# Patient Record
Sex: Female | Born: 1980 | Race: Black or African American | Hispanic: No | Smoking: Current every day smoker
Health system: Southern US, Community
[De-identification: ages and names within clinical notes are randomized; demographics above are authoritative.]

## PROBLEM LIST (undated history)

## (undated) DIAGNOSIS — J449 Chronic obstructive pulmonary disease, unspecified: Secondary | ICD-10-CM

## (undated) DIAGNOSIS — I10 Essential (primary) hypertension: Secondary | ICD-10-CM

## (undated) HISTORY — PX: KNEE ARTHROSCOPY: SUR90

## (undated) HISTORY — PX: ABDOMINAL HYSTERECTOMY: SHX81

## (undated) HISTORY — PX: CHOLECYSTECTOMY: SHX55

## (undated) HISTORY — PX: APPENDECTOMY: SHX54

## (undated) HISTORY — PX: OTHER SURGICAL HISTORY: SHX169

## (undated) HISTORY — PX: HERNIA REPAIR: SHX51

## (undated) HISTORY — PX: DIAGNOSTIC LAPAROSCOPY: SUR761

---

## 2006-10-29 ENCOUNTER — Emergency Department: Payer: Self-pay | Admitting: Unknown Physician Specialty

## 2007-01-08 ENCOUNTER — Emergency Department: Payer: Self-pay | Admitting: Emergency Medicine

## 2007-01-16 ENCOUNTER — Emergency Department: Payer: Self-pay | Admitting: Emergency Medicine

## 2007-04-21 ENCOUNTER — Emergency Department: Payer: Self-pay | Admitting: Emergency Medicine

## 2007-08-15 ENCOUNTER — Emergency Department: Payer: Self-pay | Admitting: Emergency Medicine

## 2018-04-28 ENCOUNTER — Other Ambulatory Visit: Payer: Self-pay

## 2018-04-28 ENCOUNTER — Encounter: Payer: Self-pay | Admitting: Emergency Medicine

## 2018-04-28 ENCOUNTER — Emergency Department
Admission: EM | Admit: 2018-04-28 | Discharge: 2018-04-28 | Disposition: A | Discharge status: Home / Self Care | Payer: Self-pay | Attending: Emergency Medicine | Admitting: Emergency Medicine

## 2018-04-28 DIAGNOSIS — Z760 Encounter for issue of repeat prescription: Secondary | ICD-10-CM | POA: Insufficient documentation

## 2018-04-28 DIAGNOSIS — Z79899 Other long term (current) drug therapy: Secondary | ICD-10-CM | POA: Insufficient documentation

## 2018-04-28 DIAGNOSIS — I10 Essential (primary) hypertension: Secondary | ICD-10-CM | POA: Insufficient documentation

## 2018-04-28 HISTORY — DX: Essential (primary) hypertension: I10

## 2018-04-28 MED ORDER — MIRTAZAPINE 30 MG PO TABS: 30.0000 mg | ORAL_TABLET | Freq: Every day | ORAL | 0 refills | Status: AC

## 2018-04-28 NOTE — ED Notes (Signed)

## 2018-04-28 NOTE — ED Provider Notes (Signed)
[facility] Emergency Department Provider Note  ____________________________________________   First MD Initiated Contact with Patient 04/28/18 1304     (approximate)  I have reviewed the triage vital signs and the nursing notes.   HISTORY  Chief Complaint Medication Refill   HPI Brenda Hoffman is a 38 y.o. female presents to the ED for a medication refill.  Patient states that she takes Remeron 30 mg daily and is here from [place] and only brought 7 days worth.  She is unable to return to [place] due to the pandemic currently.  She states her primary care provider is in [place].  She has been out of medication for 4 days.  She denies any problems at this time.      Past Medical History:  Diagnosis Date  . Hypertension     There are no active problems to display for this patient.   History reviewed. No pertinent surgical history.  Prior to Admission medications   Medication Sig Start Date End Date Taking? Authorizing Provider  mirtazapine (REMERON) 30 MG tablet Take 1 tablet (30 mg total) by mouth at bedtime. 04/28/18   Tommi Rumps, PA-C    Allergies Patient has no allergy information on record.  No family history on file.  Social History Social History   Tobacco Use  . Smoking status: Not on file  Substance Use Topics  . Alcohol use: Not on file  . Drug use: Not on file    Review of Systems Constitutional: No fever/chills Cardiovascular: Denies chest pain. Respiratory: Denies shortness of breath. Gastrointestinal: No abdominal pain.  No nausea, no vomiting.  Musculoskeletal: Negative for muscle skeletal pain. Skin: Negative for rash. Neurological: Negative for headaches ___________________________________________   PHYSICAL EXAM:  VITAL SIGNS: ED Triage Vitals  Enc Vitals Group     BP --      Pulse --      Resp --      Temp --      Temp src --      SpO2 --      Weight 04/28/18 1301 200 lb (90.7 kg)     Height  04/28/18 1301 5\' 5"  (1.651 m)     Head Circumference --      Peak Flow --      Pain Score 04/28/18 1300 0     Pain Loc --      Pain Edu? --      Excl. in [place]? --    Constitutional: Alert and oriented. Well appearing and in no acute distress. Eyes: Conjunctivae are normal.  Head: Atraumatic. Neck: No stridor.   Cardiovascular: Normal rate, regular rhythm. Grossly normal heart sounds.  Good peripheral circulation. Respiratory: Normal respiratory effort.  No retractions. Lungs CTAB. Musculoskeletal: Moves upper and lower extremities with any difficulty and normal gait was noted. Neurologic:  Normal speech and language. No gross focal neurologic deficits are appreciated. No gait instability. Skin:  Skin is warm, dry and intact. No rash noted. Psychiatric: Mood and affect are normal. Speech and behavior are normal.  ____________________________________________   LABS (all labs ordered are listed, but only abnormal results are displayed)  Labs Reviewed - No data to display  PROCEDURES  Procedure(s) performed (including Critical Care):  Procedures   ____________________________________________   INITIAL IMPRESSION / ASSESSMENT AND PLAN / ED COURSE  As part of my medical decision making, I reviewed the following data within the electronic MEDICAL RECORD NUMBER Notes from prior ED visits and  Controlled  Substance Database  Patient presents to the ED for medication refill.  Patient currently takes Remeron 30 mg daily and is visiting in [place].  She is unable at this time to return home due to the pandemic.  She states she brought enough for 7 days and has been out for 4 days.  She plans to return back to [place] hopefully by April 31st. ____________________________________________   FINAL CLINICAL IMPRESSION(S) / ED DIAGNOSES  Final diagnoses:  Encounter for medication refill     ED Discharge Orders         Ordered    mirtazapine (REMERON) 30 MG tablet  Daily at bedtime      04/28/18 1336           Note:  This document was prepared using Dragon voice recognition software and may include unintentional dictation errors.    Tommi Rumps, PA-C 04/28/18 1341    Arnaldo Natal, MD 04/28/18 1525

## 2018-04-28 NOTE — Discharge Instructions (Signed)
A prescription for 30 days has been written.  Follow-up with your primary care provider in [place] when you are able.

## 2018-04-28 NOTE — ED Notes (Signed)
See triage note  Presents requesting Remeron refill    States she is here from [place] and only brought 7 days   She is not able to go back at present d/t quarantine

## 2018-04-28 NOTE — ED Triage Notes (Signed)
Pt reports takes mirtazapine and has been our for 4 days. Pt states she take 30mg  daily. Pt reports MD is in [place].

## 2018-10-17 ENCOUNTER — Emergency Department
Admission: EM | Admit: 2018-10-17 | Discharge: 2018-10-17 | Disposition: A | Discharge status: Home / Self Care | Payer: Self-pay | Attending: Emergency Medicine | Admitting: Emergency Medicine

## 2018-10-17 ENCOUNTER — Encounter: Payer: Self-pay | Admitting: Emergency Medicine

## 2018-10-17 ENCOUNTER — Other Ambulatory Visit: Payer: Self-pay

## 2018-10-17 DIAGNOSIS — R1110 Vomiting, unspecified: Secondary | ICD-10-CM

## 2018-10-17 DIAGNOSIS — I10 Essential (primary) hypertension: Secondary | ICD-10-CM | POA: Insufficient documentation

## 2018-10-17 DIAGNOSIS — R112 Nausea with vomiting, unspecified: Secondary | ICD-10-CM | POA: Insufficient documentation

## 2018-10-17 DIAGNOSIS — R197 Diarrhea, unspecified: Secondary | ICD-10-CM | POA: Insufficient documentation

## 2018-10-17 LAB — URINALYSIS, COMPLETE (UACMP) WITH MICROSCOPIC
Bacteria, UA: NONE SEEN
Bilirubin Urine: NEGATIVE
Glucose, UA: NEGATIVE mg/dL
Hgb urine dipstick: NEGATIVE
Ketones, ur: NEGATIVE mg/dL
Leukocytes,Ua: NEGATIVE
Nitrite: NEGATIVE
Protein, ur: NEGATIVE mg/dL
Specific Gravity, Urine: 1.028 (ref 1.005–1.030)
pH: 7 (ref 5.0–8.0)

## 2018-10-17 LAB — CBC
HCT: 40.2 % (ref 36.0–46.0)
Hemoglobin: 13.1 g/dL (ref 12.0–15.0)
MCH: 28.9 pg (ref 26.0–34.0)
MCHC: 32.6 g/dL (ref 30.0–36.0)
MCV: 88.7 fL (ref 80.0–100.0)
Platelets: 350 10*3/uL (ref 150–400)
RBC: 4.53 MIL/uL (ref 3.87–5.11)
RDW: 15.8 % — ABNORMAL HIGH (ref 11.5–15.5)
WBC: 6.4 10*3/uL (ref 4.0–10.5)
nRBC: 0 % (ref 0.0–0.2)

## 2018-10-17 LAB — COMPREHENSIVE METABOLIC PANEL
ALT: 11 U/L (ref 0–44)
AST: 17 U/L (ref 15–41)
Albumin: 3.8 g/dL (ref 3.5–5.0)
Alkaline Phosphatase: 72 U/L (ref 38–126)
Anion gap: 9 (ref 5–15)
BUN: 13 mg/dL (ref 6–20)
CO2: 25 mmol/L (ref 22–32)
Calcium: 9.3 mg/dL (ref 8.9–10.3)
Chloride: 110 mmol/L (ref 98–111)
Creatinine, Ser: 0.73 mg/dL (ref 0.44–1.00)
GFR calc Af Amer: >60 mL/min (ref >60)
GFR calc non Af Amer: >60 mL/min (ref >60)
Glucose, Bld: 106 mg/dL — ABNORMAL HIGH (ref 70–99)
Potassium: 4.2 mmol/L (ref 3.5–5.1)
Sodium: 144 mmol/L (ref 135–145)
Total Bilirubin: 0.5 mg/dL (ref 0.3–1.2)
Total Protein: 7.2 g/dL (ref 6.5–8.1)

## 2018-10-17 LAB — PREGNANCY, URINE: Preg Test, Ur: NEGATIVE

## 2018-10-17 LAB — LIPASE, BLOOD: Lipase: 25 U/L (ref 11–51)

## 2018-10-17 MED ORDER — ONDANSETRON 4 MG PO TBDP
4.0000 mg | ORAL_TABLET | Freq: Once | ORAL | Status: AC
Start: 2018-10-17 — End: 2018-10-17
  Administered 2018-10-17: 14:00:00 4 mg via ORAL
  Filled 2018-10-17: qty 1

## 2018-10-17 MED ORDER — SODIUM CHLORIDE 0.9 % IV BOLUS
1000.0000 mL | Freq: Once | INTRAVENOUS | Status: AC
  Administered 2018-10-17: 17:00:00 1000 mL via INTRAVENOUS

## 2018-10-17 MED ORDER — ONDANSETRON HCL 4 MG/2ML IJ SOLN
4.0000 mg | Freq: Once | INTRAMUSCULAR | Status: AC
  Administered 2018-10-17: 17:00:00 4 mg via INTRAVENOUS
  Filled 2018-10-17: qty 2

## 2018-10-17 MED ORDER — DICYCLOMINE HCL 20 MG PO TABS: 20.0000 mg | ORAL_TABLET | Freq: Three times a day (TID) | ORAL | 0 refills | Status: DC | PRN

## 2018-10-17 MED ORDER — KETOROLAC TROMETHAMINE 30 MG/ML IJ SOLN
30.0000 mg | Freq: Once | INTRAMUSCULAR | Status: AC
  Administered 2018-10-17: 17:00:00 30 mg via INTRAVENOUS
  Filled 2018-10-17: qty 1

## 2018-10-17 MED ORDER — ONDANSETRON 4 MG PO TBDP: 4.0000 mg | ORAL_TABLET | Freq: Three times a day (TID) | ORAL | 0 refills | Status: DC | PRN

## 2018-10-17 NOTE — ED Triage Notes (Signed)
Pt reports NVD for the last week. Pt reports intermittent fever as well.

## 2018-10-17 NOTE — ED Provider Notes (Signed)
[facility] Emergency Department Provider Note  Time seen: 4:40 PM  I have reviewed the triage vital signs and the nursing notes.   HISTORY  Chief Complaint Nausea, Diarrhea, and Abdominal Pain   HPI Brenda Hoffman is a 38 y.o. female with a past medical history of hypertension presents to the emergency department for 1 week of nausea vomiting diarrhea.  According to the patient for the past 1 week she has been experiencing intermittent nausea vomiting and diarrhea.  She states yesterday the diarrhea appeared to worsen when she was having a bowel movement approximately once an hour, today she has felt more nauseated with vomiting and has been unable to keep down fluids which came to the emergency department for evaluation.  Denies any fever or shortness of breath.  Denies any abdominal pain.   Past Medical History:  Diagnosis Date  . Hypertension     There are no active problems to display for this patient.   History reviewed. No pertinent surgical history.  Prior to Admission medications   Medication Sig Start Date End Date Taking? Authorizing Provider  mirtazapine (REMERON) 30 MG tablet Take 1 tablet (30 mg total) by mouth at bedtime. 04/28/18   Johnn Hai, PA-C    Not on File  No family history on file.  Social History Social History   Tobacco Use  . Smoking status: Not on file  Substance Use Topics  . Alcohol use: Not on file  . Drug use: Not on file    Review of Systems Constitutional: Negative for fever Cardiovascular: Negative for chest pain. Respiratory: Negative for shortness of breath. Gastrointestinal: Negative for abdominal pain positive for nausea vomiting diarrhea x1 week. Genitourinary: Negative for urinary compaints Musculoskeletal: Negative for musculoskeletal complaints Neurological: Negative for headache All other ROS negative  ____________________________________________   PHYSICAL EXAM:  VITAL SIGNS: ED  Triage Vitals [10/17/18 1406]  Enc Vitals Group     BP (!) 143/87     Pulse Rate 85     Resp 20     Temp 98.3 F (36.8 C)     Temp Source Oral     SpO2 99 %     Weight 210 lb (95.3 kg)     Height 5\' 5"  (1.651 m)     Head Circumference      Peak Flow      Pain Score 8     Pain Loc      Pain Edu?      Excl. in [place]?    Constitutional: Alert and oriented. Well appearing and in no distress. Eyes: Normal exam ENT      Head: Normocephalic and atraumatic.      Mouth/Throat: Mucous membranes are moist. Cardiovascular: Normal rate, regular rhythm.  Respiratory: Normal respiratory effort without tachypnea nor retractions. Breath sounds are clear Gastrointestinal: Soft and nontender. No distention.   Musculoskeletal: Nontender with normal range of motion in all extremities.  Neurologic:  Normal speech and language. No gross focal neurologic deficits  Skin:  Skin is warm, dry and intact.  Psychiatric: Mood and affect are normal.    INITIAL IMPRESSION / ASSESSMENT AND PLAN / ED COURSE  Pertinent labs & imaging results that were available during my care of the patient were reviewed by me and considered in my medical decision making (see chart for details).   Patient presents to the emergency department for nausea vomiting diarrhea x1 week.  Differential would include gastroenteritis, enteritis, colitis or diverticulitis.  Reassuringly  patient has benign abdominal exam.  We will check labs, treat with fluids, Zofran and Toradol and continue to closely monitor.  Patient agreeable to plan of care.  Patient's labs are largely reassuring.  Normal white blood cell count.  Normal anion gap.  Urinalysis negative.  We will discharge the patient with Zofran and Bentyl.  Patient agreeable plan of care.  Brenda Hoffman was evaluated in Emergency Department on 10/17/2018 for the symptoms described in the history of present illness. She was evaluated in the context of the global COVID-19 pandemic, which  necessitated consideration that the patient might be at risk for infection with the SARS-CoV-2 virus that causes COVID-19. Institutional protocols and algorithms that pertain to the evaluation of patients at risk for COVID-19 are in a state of rapid change based on information released by regulatory bodies including the CDC and federal and state organizations. These policies and algorithms were followed during the patient's care in the ED.  ____________________________________________   FINAL CLINICAL IMPRESSION(S) / ED DIAGNOSES  Nausea vomiting diarrhea   Minna Antis, MD 10/17/18 1759

## 2018-10-17 NOTE — ED Notes (Signed)
AAOx3.  Skin warm and dry.  NAD 

## 2019-10-18 ENCOUNTER — Encounter: Payer: Self-pay | Admitting: Emergency Medicine

## 2019-10-18 ENCOUNTER — Emergency Department
Admission: EM | Admit: 2019-10-18 | Discharge: 2019-10-18 | Disposition: A | Discharge status: Home / Self Care | Payer: Self-pay | Attending: Student in an Organized Health Care Education/Training Program | Admitting: Student in an Organized Health Care Education/Training Program

## 2019-10-18 ENCOUNTER — Emergency Department: Payer: Self-pay

## 2019-10-18 ENCOUNTER — Other Ambulatory Visit: Payer: Self-pay

## 2019-10-18 DIAGNOSIS — I10 Essential (primary) hypertension: Secondary | ICD-10-CM | POA: Insufficient documentation

## 2019-10-18 DIAGNOSIS — W010XXA Fall on same level from slipping, tripping and stumbling without subsequent striking against object, initial encounter: Secondary | ICD-10-CM | POA: Insufficient documentation

## 2019-10-18 DIAGNOSIS — M25462 Effusion, left knee: Secondary | ICD-10-CM | POA: Insufficient documentation

## 2019-10-18 DIAGNOSIS — S8992XA Unspecified injury of left lower leg, initial encounter: Secondary | ICD-10-CM | POA: Insufficient documentation

## 2019-10-18 MED ORDER — DICYCLOMINE HCL 20 MG PO TABS: 20.0000 mg | ORAL_TABLET | Freq: Three times a day (TID) | ORAL | 0 refills | Status: AC | PRN

## 2019-10-18 MED ORDER — ONDANSETRON 4 MG PO TBDP: 4.0000 mg | ORAL_TABLET | Freq: Three times a day (TID) | ORAL | 0 refills | Status: DC | PRN

## 2019-10-18 MED ORDER — OXYCODONE-ACETAMINOPHEN 5-325 MG PO TABS
1.0000 | ORAL_TABLET | Freq: Once | ORAL | Status: AC
  Administered 2019-10-18: 1 via ORAL
  Filled 2019-10-18: qty 1

## 2019-10-18 MED ORDER — OXYCODONE-ACETAMINOPHEN 5-325 MG PO TABS: 1.0000 | ORAL_TABLET | Freq: Three times a day (TID) | ORAL | 0 refills | Status: AC | PRN

## 2019-10-18 NOTE — Discharge Instructions (Signed)
Please wear knee immobilizer and use crutches.  You can take Percocet for extreme pain.  Please call orthopedics for a follow-up appointment for recheck.

## 2019-10-18 NOTE — ED Provider Notes (Signed)
[facility] Emergency Department Provider Note  ____________________________________________  Time seen: Approximately 11:27 AM  I have reviewed the triage vital signs and the nursing notes.   HISTORY  Chief Complaint Fall and Knee Injury    HPI Brenda Hoffman is a 39 y.o. female that presents to emergency department for evaluation of left knee pain after a fall today.  Patient was at the car repair shop when she slipped and fell.  She landed on her left knee.  She has had knee surgery in the past but it has been several years and it was in [place] so she does not have an orthopedist here.  She denies any additional injuries.  She did not hit her head or lose consciousness.  Past Medical History:  Diagnosis Date   Hypertension     There are no problems to display for this patient.   History reviewed. No pertinent surgical history.  Prior to Admission medications   Medication Sig Start Date End Date Taking? Authorizing Provider  dicyclomine (BENTYL) 20 MG tablet Take 1 tablet (20 mg total) by mouth 3 (three) times daily as needed for spasms. 10/17/18 10/17/19  Minna Antis, MD  mirtazapine (REMERON) 30 MG tablet Take 1 tablet (30 mg total) by mouth at bedtime. 04/28/18   Tommi Rumps, PA-C  ondansetron (ZOFRAN ODT) 4 MG disintegrating tablet Take 1 tablet (4 mg total) by mouth every 8 (eight) hours as needed for nausea or vomiting. 10/17/18   Minna Antis, MD  oxyCODONE-acetaminophen (PERCOCET) 5-325 MG tablet Take 1 tablet by mouth every 8 (eight) hours as needed for up to 2 days for severe pain. 10/18/19 10/20/19  Enid Derry, PA-C    Allergies Patient has no known allergies.  No family history on file.  Social History Social History   Tobacco Use   Smoking status: Not on file  Substance Use Topics   Alcohol use: Not on file   Drug use: Not on file     Review of Systems  Cardiovascular: No chest pain. Respiratory: No  SOB. Gastrointestinal: No abdominal pain.  No nausea, no vomiting.  Musculoskeletal: Positive for knee pain. Skin: Negative for rash, abrasions, lacerations, ecchymosis. Neurological: Negative for headaches   ____________________________________________   PHYSICAL EXAM:  VITAL SIGNS: ED Triage Vitals  Enc Vitals Group     BP 10/18/19 1020 (!) 131/93     Pulse Rate 10/18/19 1020 69     Resp 10/18/19 1020 20     Temp 10/18/19 1020 98.5 F (36.9 C)     Temp Source 10/18/19 1020 Oral     SpO2 10/18/19 1020 100 %     Weight 10/18/19 0955 211 lb 10.3 oz (96 kg)     Height 10/18/19 0955 5\' 5"  (1.651 m)     Head Circumference --      Peak Flow --      Pain Score --      Pain Loc --      Pain Edu? --      Excl. in [place]? --      Constitutional: Alert and oriented. Well appearing and in no acute distress. Eyes: Conjunctivae are normal. PERRL. EOMI. Head: Atraumatic. ENT:      Ears:      Nose: No congestion/rhinnorhea.      Mouth/Throat: Mucous membranes are moist.  Neck: No stridor.   Cardiovascular: Normal rate, regular rhythm.  Good peripheral circulation. Respiratory: Normal respiratory effort without tachypnea or retractions. Lungs CTAB. Good  air entry to the bases with no decreased or absent breath sounds. Musculoskeletal: Full range of motion to all extremities. No gross deformities appreciated. Full ROM of knee with pain. Tenderness to palpation to lateral left knee with mild swelling.  No ecchymosis. Neurologic:  Normal speech and language. No gross focal neurologic deficits are appreciated.  Skin:  Skin is warm, dry and intact. No rash noted. Psychiatric: Mood and affect are normal. Speech and behavior are normal. Patient exhibits appropriate insight and judgement.   ____________________________________________   LABS (all labs ordered are listed, but only abnormal results are displayed)  Labs Reviewed - No data to  display ____________________________________________  EKG   ____________________________________________  RADIOLOGY Lexine Baton, personally viewed and evaluated these images (plain radiographs) as part of my medical decision making, as well as reviewing the written report by the radiologist.  DG Knee Complete 4 Views Left  Result Date: 10/18/2019 CLINICAL DATA:  Lateral left knee pain after fall EXAM: LEFT KNEE - COMPLETE 4+ VIEW COMPARISON:  None. FINDINGS: No acute fracture. No dislocation. Small left knee joint effusion. Joint spaces are maintained. Well corticated contour deformity of the anterior margin of the upper pole of the patella which may be posttraumatic or developmental. No focal soft tissue swelling. IMPRESSION: 1. Small left knee joint effusion without acute fracture or dislocation. 2. Chronic appearing contour deformity of the anterior margin of the upper pole of the patella which may be posttraumatic or developmental. Electronically Signed   By: Duanne Guess D.O.   On: 10/18/2019 10:58    ____________________________________________    PROCEDURES  Procedure(s) performed:    Procedures    Medications  oxyCODONE-acetaminophen (PERCOCET/ROXICET) 5-325 MG per tablet 1 tablet (1 tablet Oral Given 10/18/19 1130)     ____________________________________________   INITIAL IMPRESSION / ASSESSMENT AND PLAN / ED COURSE  Pertinent labs & imaging results that were available during my care of the patient were reviewed by me and considered in my medical decision making (see chart for details).  Review of the [place] CSRS was performed in accordance of the [facility] prior to dispensing any controlled drugs.   Patient presented to the emergency department for evaluation of knee pain after an injury today.  Vital signs and exam are reassuring.  X-ray negative for acute bony abnormality and reveal a small joint effusion.  Knee immobilizer was placed and crutches were given.   While in the emergency department.  Patient refuses to wear her mask and states that it is medically inadvisable for her due to her COPD.  I requested to patient that she can still wear a mask with COPD and patient continues to refuse.  Patient will be discharged home with prescriptions for a short course of Percocet. Patient is to follow up with orthopedics as directed.  Referral was given.  Patient is given ED precautions to return to the ED for any worsening or new symptoms.   Brenda Hoffman was evaluated in Emergency Department on 10/18/2019 for the symptoms described in the history of present illness. She was evaluated in the context of the global COVID-19 pandemic, which necessitated consideration that the patient might be at risk for infection with the SARS-CoV-2 virus that causes COVID-19. Institutional protocols and algorithms that pertain to the evaluation of patients at risk for COVID-19 are in a state of rapid change based on information released by regulatory bodies including the CDC and federal and state organizations. These policies and algorithms were followed during the patient's care  in the ED.  ____________________________________________  FINAL CLINICAL IMPRESSION(S) / ED DIAGNOSES  Final diagnoses:  Injury of left knee, initial encounter  Effusion of left knee      NEW MEDICATIONS STARTED DURING THIS VISIT:  ED Discharge Orders         Ordered    oxyCODONE-acetaminophen (PERCOCET) 5-325 MG tablet  Every 8 hours PRN        10/18/19 1212              This chart was dictated using voice recognition software/Dragon. Despite best efforts to proofread, errors can occur which can change the meaning. Any change was purely unintentional.    Enid Derry, PA-C 10/18/19 1512    Willy Eddy, MD 10/18/19 1517

## 2019-10-18 NOTE — ED Notes (Signed)
Placed ice pack on pt's knee in Room 41.

## 2019-10-18 NOTE — ED Triage Notes (Signed)
Slipped   Fell on left knee  Hx of knee replacement

## 2019-12-06 ENCOUNTER — Other Ambulatory Visit: Payer: Self-pay | Admitting: Physician Assistant

## 2019-12-06 DIAGNOSIS — M2392 Unspecified internal derangement of left knee: Secondary | ICD-10-CM

## 2019-12-07 ENCOUNTER — Other Ambulatory Visit: Payer: Self-pay

## 2019-12-07 ENCOUNTER — Ambulatory Visit
Admission: RE | Admit: 2019-12-07 | Discharge: 2019-12-07 | Disposition: A | Discharge status: Home / Self Care | Payer: 59 | Source: Ambulatory Visit | Attending: Physician Assistant | Admitting: Physician Assistant

## 2019-12-07 DIAGNOSIS — M2392 Unspecified internal derangement of left knee: Secondary | ICD-10-CM | POA: Insufficient documentation

## 2019-12-11 ENCOUNTER — Ambulatory Visit: Payer: Self-pay

## 2019-12-18 ENCOUNTER — Other Ambulatory Visit: Payer: Self-pay | Admitting: Internal Medicine

## 2019-12-18 DIAGNOSIS — Z1231 Encounter for screening mammogram for malignant neoplasm of breast: Secondary | ICD-10-CM

## 2020-02-07 ENCOUNTER — Other Ambulatory Visit: Payer: Self-pay | Admitting: General Surgery

## 2020-02-07 NOTE — Progress Notes (Signed)
Subjective:     Patient ID: Brenda Hoffman is a 40 y.o. female.  HPI  The following portions of the patient's history were reviewed and updated as appropriate.  This a new patient is here today for: office visit. Here to discuss issues with defecation many years post right lateral abdominal wall hernia repair, referred by Dr Yves Dill. She states has has to push on her perineal area to have a BM.  She states she has seen some blood in the stool for about 2 weeks, last seen about 1 week ago. She states her bowels move Q 2-3 days. She does use Bentyl about every 2 weeks for cramping.  The patient has a history of COPD, but does continue to smoke.  Surgical history, all completed in [place], is notable for cholecystectomy, laparoscopic in 2012.  A laparoscopic left salpingo-oophorectomy that same year with development of a right lateral abdominal wall port site hernia in 2013 requiring repair x2.  She subsequently underwent hysterectomy in 2014.  Appendectomy as a child.  The patient does make use of a tablespoon of soluble fiber supplement and a glass of water daily and uses fiber bars.  No particular pain with defecation.  The patient recently moved back to [place] to be closer to her mother who was having some health issues.  She had moved away at age 8.  She is a Producer, television/film/video for [employer].   Review of Systems  Constitutional: Negative for chills and fever.  Respiratory: Negative for cough.   Gastrointestinal: Positive for blood in stool, constipation and nausea. Negative for vomiting.       Chief Complaint  Patient presents with  . New Patient     BP 142/82   Pulse 75   Temp 36.4 C (97.5 F)   Ht 166.4 cm (5' 5.5")   Wt 95.7 kg (211 lb)   SpO2 97%   BMI 34.58 kg/m       Past Medical History:  Diagnosis Date  . Anxiety   . COPD (chronic obstructive pulmonary disease) (CMS-HCC)           Past Surgical History:   Procedure Laterality Date  . APPENDECTOMY  1989  . ARTHROSCOPY ANKLE Right 2009  . CHOLECYSTECTOMY  2012   [place]  . HERNIA REPAIR  2013   with mesh in [place]  . HYSTERECTOMY  06/2012   [place], complete  . KNEE ARTHROSCOPY Right 2012  . OOPHORECTOMY Left   . REPAIR INCISIONAL/VENTRAL HERNIA  02/2012   revision of mesh              OB History    Gravida  0   Para  0   Term  0   Preterm  0   AB  0   Living  0     SAB  0   IAB  0   Ectopic  0   Molar  0   Multiple  0   Live Births  0       Obstetric Comments  Age at first period 49          Social History          Socioeconomic History  . Marital status: Divorced    Spouse name: Not on file  . Number of children: Not on file  . Years of education: Not on file  . Highest education level: Not on file  Occupational History  . Not on file  Tobacco Use  .  Smoking status: Current Every Day Smoker    Packs/day: 1.00    Years: 20.00    Pack years: 20.00    Types: Cigarettes  . Smokeless tobacco: Never Used  Substance and Sexual Activity  . Alcohol use: Never  . Drug use: Never  . Sexual activity: Not on file  Other Topics Concern  . Not on file  Social History Narrative  . Not on file   Social Determinants of Health   Financial Resource Strain: Not on file  Food Insecurity: Not on file  Transportation Needs: Not on file       No Known Allergies  Current Medications        Current Outpatient Medications  Medication Sig Dispense Refill  . dicyclomine (BENTYL) 20 mg tablet Take 20 mg by mouth as needed    . docusate (COLACE) 100 MG capsule Take 200 mg by mouth as needed       . famotidine-calcium carbonate-magnesium hydroxide (PEPCID COMPLETE) 10-800-165 mg Chew chewable tablet Take 1 tablet by mouth once daily as needed    . ibuprofen (MOTRIN) 600 MG tablet Take 600 mg by mouth every 6 (six) hours as needed for Pain    . psyllium  (METAMUCIL) 0.52 gram capsule Take 0.52 g by mouth once daily    . bisacodyL (DULCOLAX) 5 mg EC tablet Take two tablets morning and two tablets afternoon day prior to Miralax prep. 4 tablet 0  . polyethylene glycol (MIRALAX) powder One bottle for colonoscopy prep. Use as directed. 255 g 0   No current facility-administered medications for this visit.           Family History  Problem Relation Age of Onset  . Breast cancer Maternal Aunt   . Breast cancer Maternal Aunt   . Breast cancer Paternal Aunt   . Breast cancer Paternal Aunt   . Pancreatic cancer Maternal Grandparent   . Colon cancer Maternal Grandparent          Objective:   Physical Exam Exam conducted with a chaperone present.  Constitutional:      Appearance: Normal appearance.  Cardiovascular:     Rate and Rhythm: Normal rate and regular rhythm.     Pulses: Normal pulses.     Heart sounds: Normal heart sounds.  Pulmonary:     Effort: Pulmonary effort is normal.     Breath sounds: Normal breath sounds.  Abdominal:     General: Bowel sounds are normal. There is no distension.     Palpations: Abdomen is soft.     Tenderness: There is no abdominal tenderness.     Hernia: No hernia is present.    Genitourinary:    Rectum: Normal.     Comments: Anoscopy was completed.  No internal or external hemorrhoids noted.  No fissure, fistula.  Normal sphincter tone. Musculoskeletal:     Cervical back: Neck supple.  Skin:    General: Skin is warm and dry.  Neurological:     Mental Status: She is alert and oriented to person, place, and time.  Psychiatric:        Mood and Affect: Mood normal.        Behavior: Behavior normal.    Labs and Radiology:    PCP notes were reviewed.  No labs available at this time but requested.      Assessment:     Clinical exam does not show a source of her recently reported rectal bleeding.    GI dysfunction in spite  of high-fiber diet.    Plan:     A  colonoscopy has been recommended based on her reports of rectal bleeding.  She does have a family history of colon cancer but this is to generations back.  Pros and cons of colonoscopy were reviewed.  She was instructed in regards to preparation by the staff.    Follow-up post procedure.       Entered by Dorathy Daft, RN, acting as a scribe for Dr. Donnalee Curry, MD.  The documentation recorded by the scribe accurately reflects the service I personally performed and the decisions made by me.   Earline Mayotte, MD FACS

## 2020-02-16 ENCOUNTER — Other Ambulatory Visit
Admission: RE | Admit: 2020-02-16 | Discharge: 2020-02-16 | Disposition: A | Discharge status: Home / Self Care | Payer: 59 | Source: Ambulatory Visit | Attending: General Surgery | Admitting: General Surgery

## 2020-02-19 ENCOUNTER — Other Ambulatory Visit: Payer: 59

## 2020-02-20 ENCOUNTER — Other Ambulatory Visit
Admission: RE | Admit: 2020-02-20 | Discharge: 2020-02-20 | Disposition: A | Discharge status: Home / Self Care | Payer: 59 | Source: Ambulatory Visit | Attending: General Surgery | Admitting: General Surgery

## 2020-02-20 ENCOUNTER — Other Ambulatory Visit: Payer: Self-pay

## 2020-02-20 ENCOUNTER — Encounter: Payer: Self-pay | Admitting: General Surgery

## 2020-02-20 DIAGNOSIS — Z20822 Contact with and (suspected) exposure to covid-19: Secondary | ICD-10-CM | POA: Diagnosis not present

## 2020-02-20 DIAGNOSIS — Z01812 Encounter for preprocedural laboratory examination: Secondary | ICD-10-CM | POA: Diagnosis present

## 2020-02-20 DIAGNOSIS — K625 Hemorrhage of anus and rectum: Secondary | ICD-10-CM | POA: Diagnosis not present

## 2020-02-20 LAB — SARS CORONAVIRUS 2 BY RT PCR (HOSPITAL ORDER, PERFORMED IN ~~LOC~~ HOSPITAL LAB): SARS Coronavirus 2: NEGATIVE

## 2020-02-20 NOTE — Progress Notes (Addendum)
  [facility] Perioperative Services: Pre-Admission/Anesthesia Testing     Date: 02/20/20  Name: Brenda Hoffman MRN:   [id number]  Re: SARS-CoV-2 (novel coronavirus) testing prior to colonoscopy   Case: [id number] Date/Time: 02/21/20 0900   Procedure: COLONOSCOPY WITH PROPOFOL (N/A )   Anesthesia type: General   Pre-op diagnosis: rectal bleeding   Location: [facility] ENDO ROOM 1 / [facility] ENDOSCOPY   Surgeons: Earline Mayotte, MD    Patient scheduled for the above procedure on 02/21/2020 with Dr. Donnalee Curry.  In preparation for her procedure, patient presented to the PAT clinic on 02/16/2020 for SARS-CoV-2 testing.  Received call from primary attending surgeon's office to advise that they had been contacted by endoscopy staff to make them aware that testing results not available for review.  EMR checked and sample showing as "needs collected" from 02/16/2020. Per clinical laboratory, sample was never logged in for testing. Provider's office advised PAT office manager Neva Seat) that if result not available today, patient would need to be rescheduled for her procedure. Of note, there were issues on 02/16/2020 with collection of the swab.  Patient did not want to be entered into "the national database" and delayed being swabbed for over an hour.  Nursing supervisor and [place] police became involved; patient ultimately swabbed.    Call placed to patient to discuss issue with sample being unable to be located.  Patient currently working from home and unable to come to [facility] campus today for repeat swab.  Patient understandingly upset.  In efforts to prevent patient's procedure from being canceled/postponed, while also promoting service recovery, I offered to go to patient's home and recollect swab for SARS-CoV-2 testing.  Patient in agreement.  Swab was collected.  Order placed for rapid SARS-CoV-2 testing.  Sample hand delivered to the lab for processing.  Patient was advised  that if specimen resulted as (+) she would be contacted directly to discuss results. I also advised her that he would let her primary attending surgeons office know that sample had been collected and that, barring any issues with the collected specimen, her procedure should be able to proceed tomorrow without further issues.  Patient appreciative of efforts from PAT APP in coordinating care and sample collection.  Quentin Mulling, MSN, APRN, FNP-C, CEN [facility]  Peri-operative Services Nurse Practitioner Phone: [phone] 02/20/20 2:23 PM

## 2020-02-21 ENCOUNTER — Ambulatory Visit: Payer: 59 | Admitting: Urgent Care

## 2020-02-21 ENCOUNTER — Encounter: Payer: Self-pay | Admitting: General Surgery

## 2020-02-21 ENCOUNTER — Other Ambulatory Visit: Payer: Self-pay

## 2020-02-21 ENCOUNTER — Ambulatory Visit: Payer: 59 | Admitting: Registered Nurse

## 2020-02-21 ENCOUNTER — Encounter
Admission: RE | Disposition: A | Discharge status: Home / Self Care | Payer: Self-pay | Source: Home / Self Care | Attending: General Surgery

## 2020-02-21 ENCOUNTER — Ambulatory Visit
Admission: RE | Admit: 2020-02-21 | Discharge: 2020-02-21 | Disposition: A | Discharge status: Home / Self Care | Payer: 59 | Attending: General Surgery | Admitting: General Surgery

## 2020-02-21 DIAGNOSIS — Z79899 Other long term (current) drug therapy: Secondary | ICD-10-CM | POA: Insufficient documentation

## 2020-02-21 DIAGNOSIS — K635 Polyp of colon: Secondary | ICD-10-CM | POA: Diagnosis not present

## 2020-02-21 DIAGNOSIS — R194 Change in bowel habit: Secondary | ICD-10-CM | POA: Insufficient documentation

## 2020-02-21 DIAGNOSIS — K625 Hemorrhage of anus and rectum: Secondary | ICD-10-CM | POA: Diagnosis not present

## 2020-02-21 DIAGNOSIS — K5730 Diverticulosis of large intestine without perforation or abscess without bleeding: Secondary | ICD-10-CM | POA: Diagnosis not present

## 2020-02-21 DIAGNOSIS — F17210 Nicotine dependence, cigarettes, uncomplicated: Secondary | ICD-10-CM | POA: Diagnosis not present

## 2020-02-21 HISTORY — PX: COLONOSCOPY WITH PROPOFOL: SHX5780

## 2020-02-21 HISTORY — DX: Chronic obstructive pulmonary disease, unspecified: J44.9

## 2020-02-21 SURGERY — COLONOSCOPY WITH PROPOFOL
Anesthesia: General

## 2020-02-21 MED ORDER — MIDAZOLAM HCL 2 MG/2ML IJ SOLN
INTRAMUSCULAR | Status: DC | PRN
  Administered 2020-02-21: 2 mg via INTRAVENOUS

## 2020-02-21 MED ORDER — PROPOFOL 10 MG/ML IV BOLUS
INTRAVENOUS | Status: DC | PRN
  Administered 2020-02-21: 90 mg via INTRAVENOUS

## 2020-02-21 MED ORDER — PROPOFOL 10 MG/ML IV BOLUS
INTRAVENOUS | Status: AC
  Filled 2020-02-21: qty 20

## 2020-02-21 MED ORDER — PROPOFOL 500 MG/50ML IV EMUL
INTRAVENOUS | Status: DC | PRN
  Administered 2020-02-21: 150 ug/kg/min via INTRAVENOUS

## 2020-02-21 MED ORDER — MIDAZOLAM HCL 2 MG/2ML IJ SOLN
INTRAMUSCULAR | Status: AC
  Filled 2020-02-21: qty 2

## 2020-02-21 MED ORDER — LIDOCAINE HCL (CARDIAC) PF 100 MG/5ML IV SOSY
PREFILLED_SYRINGE | INTRAVENOUS | Status: DC | PRN
  Administered 2020-02-21: 100 mg via INTRAVENOUS

## 2020-02-21 MED ORDER — SODIUM CHLORIDE 0.9 % IV SOLN: INTRAVENOUS | Status: DC

## 2020-02-21 NOTE — Anesthesia Postprocedure Evaluation (Signed)
Anesthesia Post Note  Patient: Brenda Hoffman  Procedure(s) Performed: COLONOSCOPY WITH PROPOFOL (N/A )  Patient location during evaluation: Endoscopy Anesthesia Type: General Level of consciousness: awake and alert and oriented Pain management: pain level controlled Vital Signs Assessment: post-procedure vital signs reviewed and stable Respiratory status: spontaneous breathing Cardiovascular status: blood pressure returned to baseline Anesthetic complications: no   No complications documented.   Last Vitals:  Vitals:   02/21/20 1000 02/21/20 1010  BP: (!) 139/104 (!) 142/98  Pulse: 66 61  Resp:    Temp:    SpO2: 99% 100%    Last Pain:  Vitals:   02/21/20 1010  TempSrc:   PainSc: 0-No pain                 Azaylea Maves

## 2020-02-21 NOTE — Anesthesia Preprocedure Evaluation (Signed)
Anesthesia Evaluation  Patient identified by MRN, date of birth, ID band Patient awake    Reviewed: Allergy & Precautions, NPO status , Patient's Chart, lab work & pertinent test results  Airway Mallampati: III       Dental   Pulmonary COPD, Current Smoker,    Pulmonary exam normal        Cardiovascular hypertension, Normal cardiovascular exam     Neuro/Psych negative neurological ROS  negative psych ROS   GI/Hepatic negative GI ROS, Neg liver ROS,   Endo/Other  negative endocrine ROS  Renal/GU negative Renal ROS  Female GU complaint     Musculoskeletal negative musculoskeletal ROS (+)   Abdominal Normal abdominal exam  (+)   Peds negative pediatric ROS (+)  Hematology negative hematology ROS (+)   Anesthesia Other Findings   Reproductive/Obstetrics                             Anesthesia Physical Anesthesia Plan  ASA: III  Anesthesia Plan: General   Post-op Pain Management:    Induction: Intravenous  PONV Risk Score and Plan: Propofol infusion  Airway Management Planned: Nasal Cannula  Additional Equipment:   Intra-op Plan:   Post-operative Plan:   Informed Consent: I have reviewed the patients History and Physical, chart, labs and discussed the procedure including the risks, benefits and alternatives for the proposed anesthesia with the patient or authorized representative who has indicated his/her understanding and acceptance.     Dental advisory given  Plan Discussed with: CRNA and Surgeon  Anesthesia Plan Comments:         Anesthesia Quick Evaluation

## 2020-02-21 NOTE — Op Note (Signed)
[facility] Gastroenterology Patient Name: Brenda Hoffman Procedure Date: 02/21/2020 8:58 AM MRN: [id number] Account #: [id number] Date of Birth: [birth year] Admit Type: Outpatient Age: 40 Room: [facility] ENDO ROOM 1 Gender: Female Note Status: Finalized Procedure:             Colonoscopy Indications:           Rectal bleeding, Change in bowel habits Providers:             Earline Mayotte, MD Referring MD:          [facility] Medicines:             Monitored Anesthesia Care Complications:         No immediate complications. Procedure:             Pre-Anesthesia Assessment:                        - Prior to the procedure, a History and Physical was                         performed, and patient medications, allergies and                         sensitivities were reviewed. The patient's tolerance                         of previous anesthesia was reviewed.                        - The risks and benefits of the procedure and the                         sedation options and risks were discussed with the                         patient. All questions were answered and informed                         consent was obtained.                        After obtaining informed consent, the colonoscope was                         passed under direct vision. Throughout the procedure,                         the patient's blood pressure, pulse, and oxygen                         saturations were monitored continuously. The                         Colonoscope was introduced through the anus and                         advanced to the the cecum, identified by appendiceal                         orifice and ileocecal valve.  The colonoscopy was                         somewhat difficult due to poor bowel prep with stool                         present. Successful completion of the procedure was                         aided by using manual pressure. The patient tolerated                          the procedure well. The quality of the bowel                         preparation was adequate to identify polyps 6 mm and                         larger in size. Findings:      Multiple medium-mouthed diverticula were found in the sigmoid colon.      The retroflexed view of the distal rectum and anal verge was normal and       showed no anal or rectal abnormalities.      The perianal examination was normal. Impression:            - Diverticulosis in the sigmoid colon.                        - The distal rectum and anal verge are normal on                         retroflexion view.                        - No specimens collected. Recommendation:        - Return to endoscopist in 2 weeks. Procedure Code(s):     --- Professional ---                        [phone], Colonoscopy, flexible; diagnostic, including                         collection of specimen(s) by brushing or washing, when                         performed (separate procedure) Diagnosis Code(s):     --- Professional ---                        K62.5, Hemorrhage of anus and rectum                        R19.4, Change in bowel habit                        K57.30, Diverticulosis of large intestine without                         perforation or abscess without bleeding CPT copyright 2019 American Medical Association. All rights reserved. The codes documented in this  report are preliminary and upon coder review may  be revised to meet current compliance requirements. Earline Mayotte, MD 02/21/2020 9:40:19 AM This report has been signed electronically. Number of Addenda: 0 Note Initiated On: 02/21/2020 8:58 AM Scope Withdrawal Time: 0 hours 22 minutes 8 seconds  Total Procedure Duration: 0 hours 33 minutes 3 seconds       [facility]

## 2020-02-21 NOTE — H&P (Signed)
Brenda Hoffman [id number] 1980/07/21     HPI:  40 y/o woman with change in bowel habits, difficult defecation with the need to apply pressure to the perineum. Multiple prior abdominal procedures (LSO,hysterctomy, port site hernia repair.). Tolerated prep well, reports brown, watery stool today.   Medications Prior to Admission  Medication Sig Dispense Refill Last Dose  . dicyclomine (BENTYL) 20 MG tablet Take 1 tablet (20 mg total) by mouth 3 (three) times daily as needed for spasms. 30 tablet 0 Past Week at Unknown time  . mirtazapine (REMERON) 30 MG tablet Take 1 tablet (30 mg total) by mouth at bedtime. 30 tablet 0 Past Week at Unknown time  . ondansetron (ZOFRAN ODT) 4 MG disintegrating tablet Take 1 tablet (4 mg total) by mouth every 8 (eight) hours as needed for nausea or vomiting. 20 tablet 0 Past Week at Unknown time   No Known Allergies Past Medical History:  Diagnosis Date  . COPD (chronic obstructive pulmonary disease) (HCC)    Past Surgical History:  Procedure Laterality Date  . ABDOMINAL HYSTERECTOMY    . APPENDECTOMY    . CHOLECYSTECTOMY    . DIAGNOSTIC LAPAROSCOPY    . HERNIA REPAIR    . KNEE ARTHROSCOPY    . oopherrectomy Left    Social History   Socioeconomic History  . Marital status: Divorced    Spouse name: Not on file  . Number of children: Not on file  . Years of education: Not on file  . Highest education level: Not on file  Occupational History  . Not on file  Tobacco Use  . Smoking status: Current Every Day Smoker    Packs/day: 1.00  . Smokeless tobacco: Never Used  Substance and Sexual Activity  . Alcohol use: Not on file  . Drug use: Not on file  . Sexual activity: Not on file  Other Topics Concern  . Not on file  Social History Narrative  . Not on file   Social Determinants of Health   Financial Resource Strain: Not on file  Food Insecurity: Not on file  Transportation Needs: Not on file  Physical Activity: Not on file  Stress:  Not on file  Social Connections: Not on file  Intimate Partner Violence: Not on file   Social History   Social History Narrative  . Not on file     ROS: Negative.     PE: HEENT: Negative. Lungs: Clear. Cardio: RR.     Assessment/Plan:  Proceed with planned colonoscopy.    Merrily [facility] 02/21/2020

## 2020-02-21 NOTE — Transfer of Care (Signed)
Immediate Anesthesia Transfer of Care Note  Patient: Brenda Hoffman  Procedure(s) Performed: COLONOSCOPY WITH PROPOFOL (N/A )  Patient Location: Endoscopy Unit  Anesthesia Type:General  Level of Consciousness: drowsy  Airway & Oxygen Therapy: Patient Spontanous Breathing  Post-op Assessment: Report given to RN and Post -op Vital signs reviewed and stable  Post vital signs: Reviewed and stable  Last Vitals:  See Epic for complete vitals Vitals Value Taken Time  BP 138/93 02/21/20 0941  Temp    Pulse    Resp 22 02/21/20 0941  SpO2    Vitals shown include unvalidated device data.  Last Pain:  Vitals:   02/21/20 0833  TempSrc: Temporal  PainSc: 0-No pain         Complications: No complications documented.

## 2020-02-22 ENCOUNTER — Encounter: Payer: Self-pay | Admitting: General Surgery

## 2020-03-05 ENCOUNTER — Other Ambulatory Visit: Payer: Self-pay | Admitting: General Surgery

## 2020-03-05 DIAGNOSIS — T8183XA Persistent postprocedural fistula, initial encounter: Secondary | ICD-10-CM

## 2020-03-05 NOTE — Progress Notes (Signed)
Patient reports flatus via vagina post colonoscopy. S/P hysterectomy, oophorectomy.

## 2020-03-18 ENCOUNTER — Ambulatory Visit: Admission: RE | Admit: 2020-03-18 | Payer: 59 | Source: Ambulatory Visit

## 2020-04-02 ENCOUNTER — Ambulatory Visit
Admission: RE | Admit: 2020-04-02 | Discharge: 2020-04-02 | Disposition: A | Discharge status: Home / Self Care | Payer: 59 | Source: Ambulatory Visit | Attending: General Surgery | Admitting: General Surgery

## 2020-04-02 ENCOUNTER — Ambulatory Visit: Payer: 59

## 2020-04-02 ENCOUNTER — Other Ambulatory Visit: Payer: Self-pay

## 2020-04-02 DIAGNOSIS — T8183XA Persistent postprocedural fistula, initial encounter: Secondary | ICD-10-CM | POA: Diagnosis not present

## 2020-04-02 MED ORDER — IOHEXOL 300 MG/ML  SOLN
100.0000 mL | Freq: Once | INTRAMUSCULAR | Status: AC | PRN
  Administered 2020-04-02: 100 mL via INTRAVENOUS

## 2020-05-02 NOTE — Addendum Note (Signed)
Encounter addended by: Novella Olive on: 05/02/2020 6:23 PM  Actions taken: Letter saved

## 2020-07-07 ENCOUNTER — Emergency Department
Admission: EM | Admit: 2020-07-07 | Discharge: 2020-07-07 | Disposition: A | Discharge status: Home / Self Care | Payer: 59 | Attending: Emergency Medicine | Admitting: Emergency Medicine

## 2020-07-07 ENCOUNTER — Emergency Department: Payer: 59

## 2020-07-07 ENCOUNTER — Other Ambulatory Visit: Payer: Self-pay

## 2020-07-07 ENCOUNTER — Encounter: Payer: Self-pay | Admitting: Emergency Medicine

## 2020-07-07 DIAGNOSIS — R1031 Right lower quadrant pain: Secondary | ICD-10-CM | POA: Insufficient documentation

## 2020-07-07 DIAGNOSIS — F17210 Nicotine dependence, cigarettes, uncomplicated: Secondary | ICD-10-CM | POA: Insufficient documentation

## 2020-07-07 DIAGNOSIS — J449 Chronic obstructive pulmonary disease, unspecified: Secondary | ICD-10-CM | POA: Diagnosis not present

## 2020-07-07 LAB — COMPREHENSIVE METABOLIC PANEL
ALT: 10 U/L (ref 0–44)
AST: 14 U/L — ABNORMAL LOW (ref 15–41)
Albumin: 4.3 g/dL (ref 3.5–5.0)
Alkaline Phosphatase: 72 U/L (ref 38–126)
Anion gap: 7 (ref 5–15)
BUN: 16 mg/dL (ref 6–20)
CO2: 25 mmol/L (ref 22–32)
Calcium: 9.3 mg/dL (ref 8.9–10.3)
Chloride: 110 mmol/L (ref 98–111)
Creatinine, Ser: 0.75 mg/dL (ref 0.44–1.00)
GFR, Estimated: >60 mL/min (ref >60)
Glucose, Bld: 88 mg/dL (ref 70–99)
Potassium: 3.9 mmol/L (ref 3.5–5.1)
Sodium: 142 mmol/L (ref 135–145)
Total Bilirubin: 0.7 mg/dL (ref 0.3–1.2)
Total Protein: 8 g/dL (ref 6.5–8.1)

## 2020-07-07 LAB — CBC
HCT: 42.2 % (ref 36.0–46.0)
Hemoglobin: 14 g/dL (ref 12.0–15.0)
MCH: 29.7 pg (ref 26.0–34.0)
MCHC: 33.2 g/dL (ref 30.0–36.0)
MCV: 89.6 fL (ref 80.0–100.0)
Platelets: 343 10*3/uL (ref 150–400)
RBC: 4.71 MIL/uL (ref 3.87–5.11)
RDW: 13.8 % (ref 11.5–15.5)
WBC: 4.8 10*3/uL (ref 4.0–10.5)
nRBC: 0 % (ref 0.0–0.2)

## 2020-07-07 LAB — URINALYSIS, COMPLETE (UACMP) WITH MICROSCOPIC
Bacteria, UA: NONE SEEN
Bilirubin Urine: NEGATIVE
Glucose, UA: NEGATIVE mg/dL
Hgb urine dipstick: NEGATIVE
Ketones, ur: NEGATIVE mg/dL
Leukocytes,Ua: NEGATIVE
Nitrite: NEGATIVE
Protein, ur: NEGATIVE mg/dL
Specific Gravity, Urine: 1.028 (ref 1.005–1.030)
pH: 5 (ref 5.0–8.0)

## 2020-07-07 LAB — POC URINE PREG, ED: Preg Test, Ur: NEGATIVE

## 2020-07-07 LAB — LIPASE, BLOOD: Lipase: 30 U/L (ref 11–51)

## 2020-07-07 MED ORDER — HYDROMORPHONE HCL 1 MG/ML IJ SOLN
1.0000 mg | Freq: Once | INTRAMUSCULAR | Status: AC
  Administered 2020-07-07: 13:00:00 1 mg via INTRAVENOUS
  Filled 2020-07-07: qty 1

## 2020-07-07 MED ORDER — ONDANSETRON HCL 4 MG/2ML IJ SOLN
4.0000 mg | Freq: Once | INTRAMUSCULAR | Status: AC
  Administered 2020-07-07: 13:00:00 4 mg via INTRAVENOUS
  Filled 2020-07-07: qty 2

## 2020-07-07 MED ORDER — TRAMADOL HCL 50 MG PO TABS: 50.0000 mg | ORAL_TABLET | Freq: Four times a day (QID) | ORAL | 0 refills | Status: DC | PRN

## 2020-07-07 MED ORDER — NAPROXEN 500 MG PO TABS: 500.0000 mg | ORAL_TABLET | Freq: Two times a day (BID) | ORAL | 0 refills | Status: DC

## 2020-07-07 MED ORDER — IOHEXOL 300 MG/ML  SOLN
100.0000 mL | Freq: Once | INTRAMUSCULAR | Status: AC | PRN
  Administered 2020-07-07: 15:00:00 100 mL via INTRAVENOUS

## 2020-07-07 NOTE — Discharge Instructions (Addendum)
Please follow-up with primary care provider for symptoms that are not improving over the next few days.  Please be aware that the pain medication prescribed today may cause you to be drowsy or sleepy.  Do not drive until you know how this affects you.  Return to the emergency department for symptoms of change or worsen if unable to schedule appointment.

## 2020-07-07 NOTE — ED Notes (Signed)
Patient transported to CT 

## 2020-07-07 NOTE — ED Triage Notes (Signed)
Pt reports that she woke up this am with RLQ pain, She is having acid reflux feeling. She is diaphoretic. She is unable to sit down because of pain

## 2020-07-07 NOTE — ED Provider Notes (Signed)
[facility] Emergency Department Provider Note ____________________________________________   Event Date/Time   First MD Initiated Contact with Patient 07/07/20 1229     (approximate)  I have reviewed the triage vital signs and the nursing notes.   HISTORY  Chief Complaint Abdominal Pain  HPI Brenda Hoffman is a 40 y.o. female with history of hysterectomy, appendectomy, cholecystectomy, bowel obstruction, hernia repair and remaining history as listed below presents to the emergency department for treatment and evaluation of right lower quadrant abdominal pain.  Patient states that she woke up this morning with pain.  She has been sweaty and has been unable to stretch out or ambulate without experiencing pain.  She feels that this is similar to her previous hernia or bowel obstruction.  She denies fever, nausea, vomiting, diarrhea.         Past Medical History:  Diagnosis Date   COPD (chronic obstructive pulmonary disease) (HCC)     There are no problems to display for this patient.   Past Surgical History:  Procedure Laterality Date   ABDOMINAL HYSTERECTOMY     APPENDECTOMY     CHOLECYSTECTOMY     COLONOSCOPY WITH PROPOFOL N/A 02/21/2020   Procedure: COLONOSCOPY WITH PROPOFOL;  Surgeon: Earline Mayotte, MD;  Location: [facility] ENDOSCOPY;  Service: Endoscopy;  Laterality: N/A;   DIAGNOSTIC LAPAROSCOPY     HERNIA REPAIR     KNEE ARTHROSCOPY     oopherrectomy Left     Prior to Admission medications   Medication Sig Start Date End Date Taking? Authorizing Provider  naproxen (NAPROSYN) 500 MG tablet Take 1 tablet (500 mg total) by mouth 2 (two) times daily with a meal. 07/07/20  Yes Alexandra Posadas B, FNP  traMADol (ULTRAM) 50 MG tablet Take 1 tablet (50 mg total) by mouth every 6 (six) hours as needed. 07/07/20  Yes Tahj Njoku B, FNP  dicyclomine (BENTYL) 20 MG tablet Take 1 tablet (20 mg total) by mouth 3 (three) times daily as needed for  spasms. 10/18/19 10/17/20  Willy Eddy, MD  mirtazapine (REMERON) 30 MG tablet Take 1 tablet (30 mg total) by mouth at bedtime. 04/28/18   Tommi Rumps, PA-C  ondansetron (ZOFRAN ODT) 4 MG disintegrating tablet Take 1 tablet (4 mg total) by mouth every 8 (eight) hours as needed for nausea or vomiting. 10/18/19   Willy Eddy, MD    Allergies Patient has no known allergies.  No family history on file.  Social History Social History   Tobacco Use   Smoking status: Every Day    Packs/day: 1.00    Pack years: 0.00    Types: Cigarettes   Smokeless tobacco: Never    Review of Systems  Constitutional: No fever/chills Eyes: No visual changes. ENT: No sore throat. Cardiovascular: Denies chest pain. Respiratory: Denies shortness of breath. Gastrointestinal: Positive for abdominal pain.  No nausea, no vomiting.  No diarrhea.  No constipation. Genitourinary: Negative for dysuria. Musculoskeletal: Negative for back pain. Skin: Negative for rash. Neurological: Negative for headaches, focal weakness or numbness. ____________________________________________   PHYSICAL EXAM:  VITAL SIGNS: ED Triage Vitals [07/07/20 1103]  Enc Vitals Group     BP (!) 145/108     Pulse Rate 92     Resp 18     Temp 98.6 F (37 C)     Temp src      SpO2 96 %     Weight 200 lb (90.7 kg)     Height 5\' 5"  (1.651  m)     Head Circumference      Peak Flow      Pain Score 8     Pain Loc      Pain Edu?      Excl. in GC?     Constitutional: Alert and oriented. Well appearing and in no acute distress. Eyes: Conjunctivae are normal. PERRL. EOMI. Head: Atraumatic. Nose: No congestion/rhinnorhea. Mouth/Throat: Mucous membranes are moist.  Oropharynx non-erythematous. Neck: No stridor.   Hematological/Lymphatic/Immunilogical: No cervical lymphadenopathy. Cardiovascular: Normal rate, regular rhythm. Grossly normal heart sounds.  Good peripheral circulation. Respiratory: Normal respiratory  effort.  No retractions. Lungs CTAB. Gastrointestinal: Soft with tenderness central to the hernia scar over the right lower quadrant. No distention. No abdominal bruits. No CVA tenderness. Genitourinary:  Musculoskeletal: No lower extremity tenderness nor edema.  No joint effusions. Neurologic:  Normal speech and language. No gross focal neurologic deficits are appreciated. No gait instability. Skin:  Skin is warm, dry and intact. No rash noted. Psychiatric: Mood and affect are normal. Speech and behavior are normal.  ____________________________________________   LABS (all labs ordered are listed, but only abnormal results are displayed)  Labs Reviewed  COMPREHENSIVE METABOLIC PANEL - Abnormal; Notable for the following components:      Result Value   AST 14 (*)    All other components within normal limits  URINALYSIS, COMPLETE (UACMP) WITH MICROSCOPIC - Abnormal; Notable for the following components:   Color, Urine YELLOW (*)    APPearance CLEAR (*)    All other components within normal limits  POC URINE PREG, ED - Normal  LIPASE, BLOOD  CBC   ____________________________________________  EKG  Not indicated ____________________________________________  RADIOLOGY  ED MD interpretation:    CT of the abdomen pelvis is negative for acute concerns per radiology.  I, Kem Boroughs, personally viewed and evaluated these images (plain radiographs) as part of my medical decision making, as well as reviewing the written report by the radiologist.  Official radiology report(s): CT ABDOMEN PELVIS W CONTRAST  Result Date: 07/07/2020 CLINICAL DATA:  Right lower quadrant abdominal pain. EXAM: CT ABDOMEN AND PELVIS WITH CONTRAST TECHNIQUE: Multidetector CT imaging of the abdomen and pelvis was performed using the standard protocol following bolus administration of intravenous contrast. CONTRAST:  OMNIPAQUE IOHEXOL 300 MG/ML  SOLN COMPARISON:  CT abdomen pelvis dated April 02, 2020.  FINDINGS: Lower chest: No acute abnormality. Unchanged mild scarring at the lung bases and 2.7 cm subpleural cyst in the posterior left lower lobe. Hepatobiliary: No focal liver abnormality is seen. Status post cholecystectomy. No biliary dilatation. Pancreas: Unremarkable. No pancreatic ductal dilatation or surrounding inflammatory changes. Spleen: Normal in size without focal abnormality. Adrenals/Urinary Tract: Adrenal glands are unremarkable. Kidneys are normal, without renal calculi, focal lesion, or hydronephrosis. Bladder is largely decompressed. Stomach/Bowel: Stomach is within normal limits. The appendix is not visualized in this patient with a history of prior appendectomy. No evidence of bowel wall thickening, distention, or inflammatory changes. Vascular/Lymphatic: No significant vascular findings are present. No enlarged abdominal or pelvic lymph nodes. Reproductive: Status post hysterectomy. No adnexal masses. Other: No free fluid or pneumoperitoneum. Musculoskeletal: No acute or significant osseous findings. IMPRESSION: 1. No acute intra-abdominal process. Electronically Signed   By: Obie Dredge M.D.   On: 07/07/2020 15:11    ____________________________________________   PROCEDURES  Procedure(s) performed (including Critical Care):  Procedures  ____________________________________________   INITIAL IMPRESSION / ASSESSMENT AND PLAN     40 year old female presenting to the emergency department  for treatment and evaluation of right lower quadrant tenderness.  See HPI for further details.  Plan will be to review protocol labs drawn while awaiting ER room assignment.  Exam is overall reassuring with the exception of focal tenderness to the right lower quadrant central to the abdominal scar from her hernia repair.  There is no associated erythema or appearance of abscess.  DIFFERENTIAL DIAGNOSIS  Abdominal adhesions, small bowel obstruction, hernia  ED COURSE  Labs are  reassuring.  She says no leukocytosis with white blood cell count 4.8 with no indication of left shift.  CMP is normal.  CT of the abdomen and pelvis is reassuring.  Pain is localized central to the right lower quadrant scar.  She was advised that this may have developed due to excessive pressure over the area or potentially adhesions.  Will treat symptomatically and have her follow-up with primary care if not improving over the next few days.  For any symptoms that change, worsen, or for new concerns if unable to see primary care she is to return to the emergency department.    ___________________________________________   FINAL CLINICAL IMPRESSION(S) / ED DIAGNOSES  Final diagnoses:  Right lower quadrant abdominal pain     ED Discharge Orders          Ordered    traMADol (ULTRAM) 50 MG tablet  Every 6 hours PRN        07/07/20 1524    naproxen (NAPROSYN) 500 MG tablet  2 times daily with meals        07/07/20 1524             Karisa L Heidler was evaluated in Emergency Department on 07/08/2020 for the symptoms described in the history of present illness. She was evaluated in the context of the global COVID-19 pandemic, which necessitated consideration that the patient might be at risk for infection with the SARS-CoV-2 virus that causes COVID-19. Institutional protocols and algorithms that pertain to the evaluation of patients at risk for COVID-19 are in a state of rapid change based on information released by regulatory bodies including the CDC and federal and state organizations. These policies and algorithms were followed during the patient's care in the ED.   Note:  This document was prepared using Dragon voice recognition software and may include unintentional dictation errors.    Chinita Pester, FNP 07/08/20 1300    Concha Se, MD 07/08/20 1701

## 2021-03-01 IMAGING — MR MR KNEE*L* W/O CM
6 series · 40 of 40 positions shown · non-contrast
Comparison: Plain films left knee 10/18/2019.

CLINICAL DATA: The patient suffered a left knee injury in a slip
and fall 10/18/2019. History of prior left knee surgery. Subsequent
encounter.

EXAM:
MRI OF THE LEFT KNEE WITHOUT CONTRAST
TECHNIQUE: Multiplanar, multisequence MR imaging of the knee was performed. No
intravenous contrast was administered.

[Series 9: T1 · coronal · left · 4.0mm · 0.39mm/px · 6 of 32 slices shown]
[im 1/32]
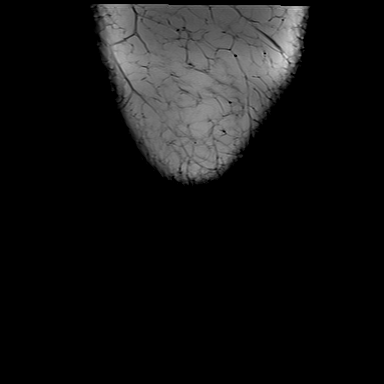
[im 7/32]
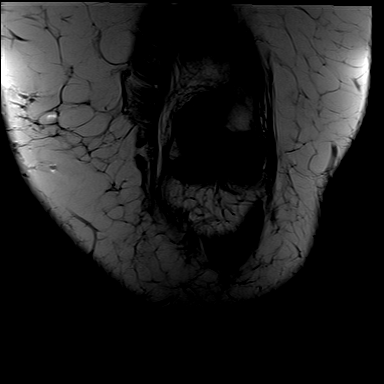
[im 13/32]
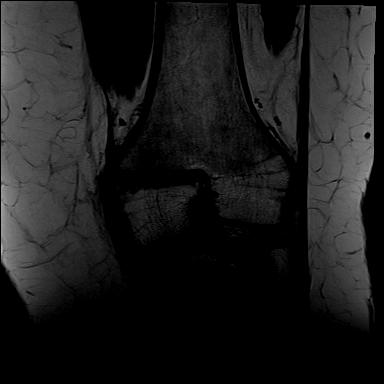
[im 19/32]
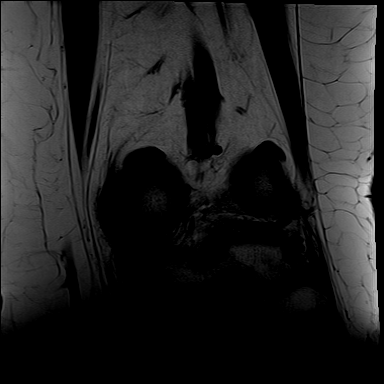
[im 25/32]
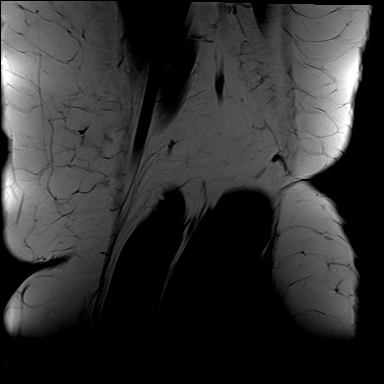
[im 32/32]
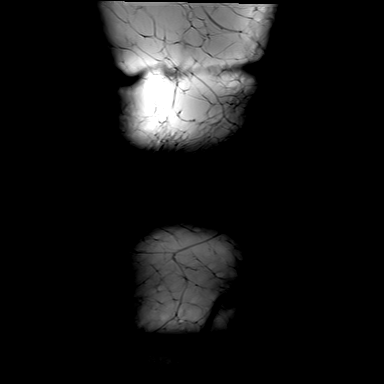

[Series 10: T2 fat-sat · coronal · left · 4.0mm · 0.59mm/px · 7 of 32 slices shown (1 of 3)]
[im 1/32]
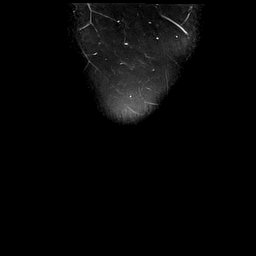
[im 6/32]
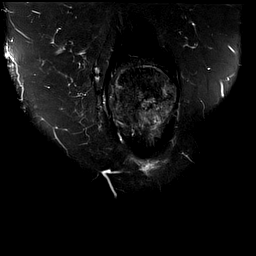
[im 11/32]
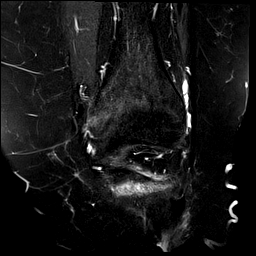
[im 16/32]
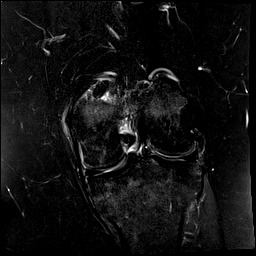
[im 21/32]
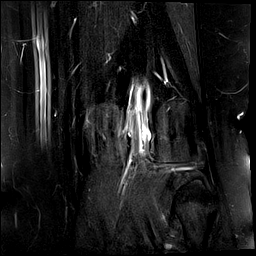
[im 26/32]
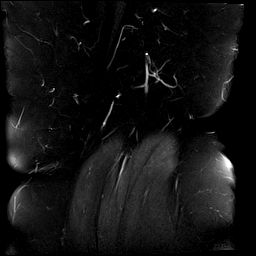
[im 32/32]
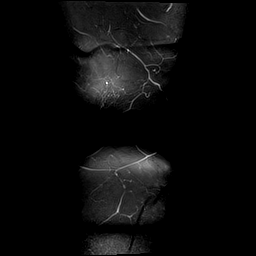

[Series 11: PD fat-sat · coronal · left · 4.0mm · 0.59mm/px · 7 of 32 slices shown (1 of 2)]
[im 1/32]
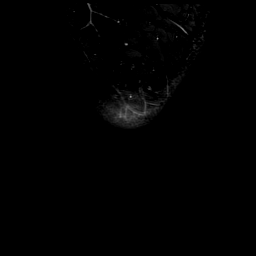
[im 6/32]
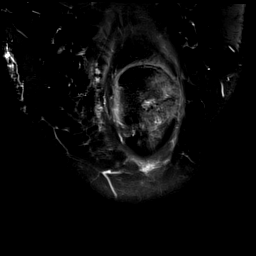
[im 11/32]
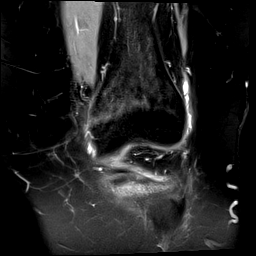
[im 16/32]
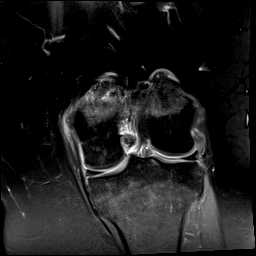
[im 21/32]
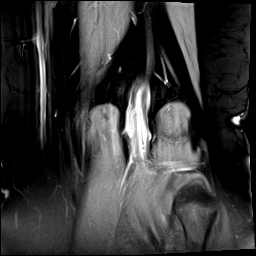
[im 26/32]
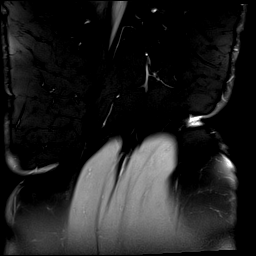
[im 32/32]
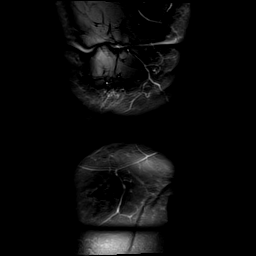

[Series 12: PD fat-sat · sagittal · left · 3.0mm · 0.59mm/px · 6 of 28 slices shown (2 of 2)]
[im 1/28]
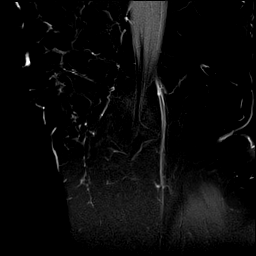
[im 6/28]
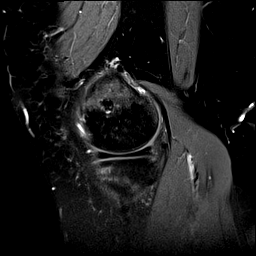
[im 11/28]
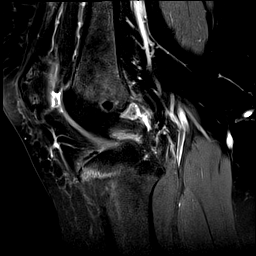
[im 17/28]
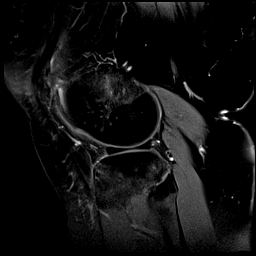
[im 22/28]
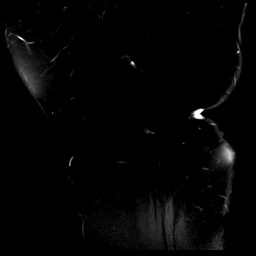
[im 28/28]
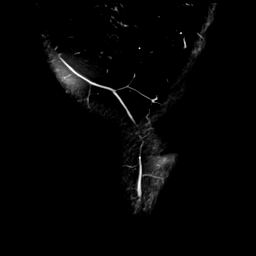

[Series 13: T2 fat-sat · sagittal · left · 3.0mm · 0.59mm/px · 8 of 36 slices shown (2 of 3)]
[im 1/36]
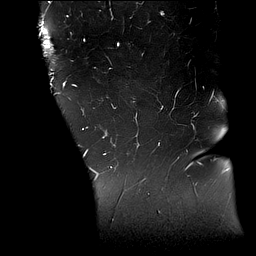
[im 6/36]
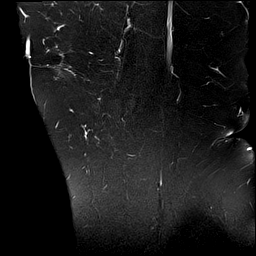
[im 11/36]
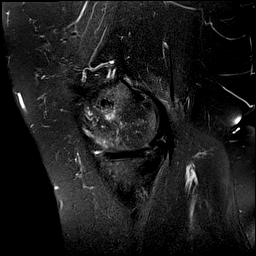
[im 16/36]
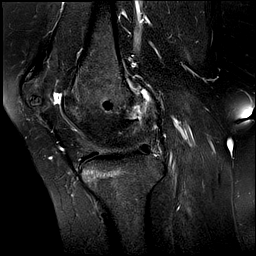
[im 21/36]
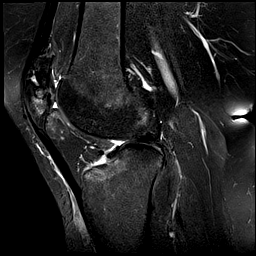
[im 26/36]
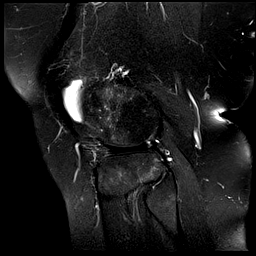
[im 31/36]
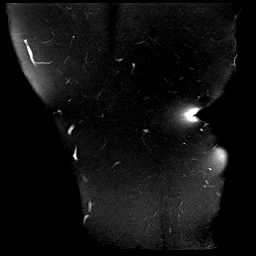
[im 36/36]
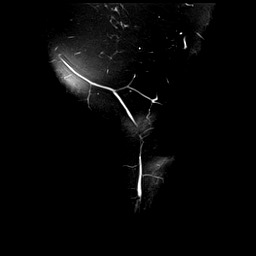

[Series 14: T2 fat-sat · axial · left · 4.0mm · 0.50mm/px · z∈[-89,+36]mm · 6 of 26 slices shown (3 of 3)]
[im 1/26]
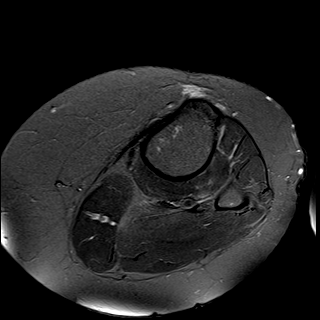
[im 6/26]
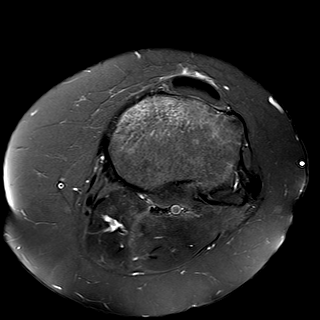
[im 11/26]
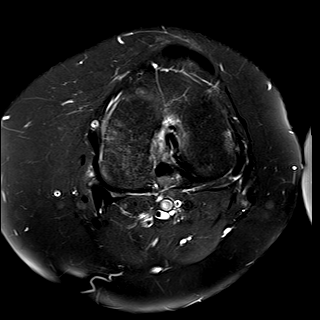
[im 16/26]
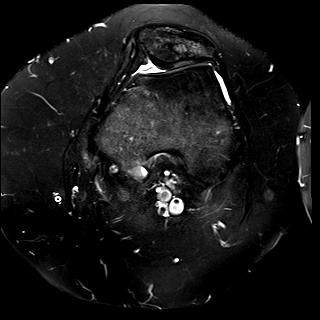
[im 21/26]
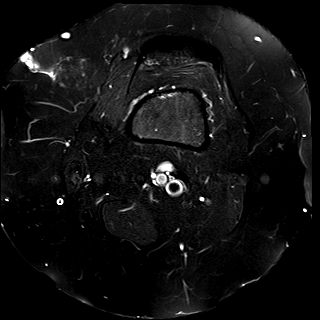
[im 26/26]
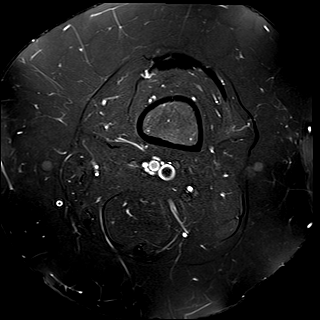

[40 of 40 positions shown; findings below may reference images not displayed]

FINDINGS: MENISCI

Medial meniscus:  Intact.

Lateral meniscus:  Intact.

LIGAMENTS

Cruciates:  Intact.

Collaterals: Intact. There is a linear focus of decreased signal in
the medial epicondyle of the femur suggestive of prior MCL repair.

CARTILAGE

Patellofemoral: Thinning and fissuring in hyaline cartilage of the
lateral patellar facet in the superior pole noted.

Medial:  Preserved.

Lateral:  Preserved.

Joint:  Trace amount of joint fluid.

Popliteal Fossa:  No Baker's cyst.

Extensor Mechanism:  Intact.

Bones: The patient has a nondisplaced and incomplete fracture of the
anterior metaphysis of the medial tibia. No other fracture is
identified. Defect in the superior pole of the patella and a linear
focus of T2 hyperintensity in the mid aspect of the patellar are
most consistent with prior surgery.

Other: None.
IMPRESSION: Findings consistent with an acute or early subacute nondisplaced and
incomplete fracture of the anterior aspect of the medial tibial
metaphysis. No disruption of the articular surface.

Negative for meniscal or ligament tear.

Postoperative change as described.

## 2021-07-09 NOTE — Congregational Nurse Program (Signed)
  Dept: [phone]   Congregational Nurse Program Note  Date of Encounter: 07/09/2021  Past Medical History: Past Medical History:  Diagnosis Date   COPD (chronic obstructive pulmonary disease) (HCC)     Encounter Details:  CNP Questionnaire - 07/09/21 1430       Questionnaire   Do you give verbal consent to treat you today? Yes    Visit Setting Church or Organization    Patient Status Unknown    Insurance Uninsured ([employer]/Self-Pay)    Corporate treasurer Care    Medication Referred to Medication Assistance;Have Medication Insecurities    Medical Provider No    Screening Referrals Annual Wellness Visit    Medical Referral [facility]    Medical Appointment Made N/A    Food Have Food Insecurities    Transportation N/A    Housing/Utilities N/A    Interpersonal Safety N/A    Intervention Blood pressure;Educate;Navigate Healthcare System;Counsel    ED Visit Averted N/A    Life-Saving Intervention Made N/A           [facility] only [facility] the [facility] for BP screening. Initial visit. Reviewed confidentiality and HIPAA. Client states she hasn't had well visit in over two years, has no PCP, and no insurance. 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 Family hx significant for diabetes and HTN. She was told years ago that she was on the "border for having prediabetes". BP is 140/100 and recheck was 132/100 5 minutes later. Co. Re risks of the elevated diastolic BP. Co re signs and symptoms that need immediate care. Provided application for [facility] and [facility] (client chooses to complete this on her own). Co. Re availability of this nurse to assist as needed and to RTC prn. Encouraged BP check next week. Rhermann, RN

## 2021-11-13 ENCOUNTER — Other Ambulatory Visit: Payer: Self-pay

## 2021-11-13 ENCOUNTER — Encounter: Payer: Self-pay | Admitting: Intensive Care

## 2021-11-13 ENCOUNTER — Emergency Department
Admission: EM | Admit: 2021-11-13 | Discharge: 2021-11-13 | Disposition: A | Discharge status: Home / Self Care | Payer: Self-pay | Attending: Emergency Medicine | Admitting: Emergency Medicine

## 2021-11-13 ENCOUNTER — Emergency Department: Payer: Self-pay

## 2021-11-13 DIAGNOSIS — K5732 Diverticulitis of large intestine without perforation or abscess without bleeding: Secondary | ICD-10-CM | POA: Insufficient documentation

## 2021-11-13 DIAGNOSIS — K5792 Diverticulitis of intestine, part unspecified, without perforation or abscess without bleeding: Secondary | ICD-10-CM

## 2021-11-13 DIAGNOSIS — R1032 Left lower quadrant pain: Secondary | ICD-10-CM

## 2021-11-13 LAB — COMPREHENSIVE METABOLIC PANEL
ALT: 11 U/L (ref 0–44)
AST: 16 U/L (ref 15–41)
Albumin: 4 g/dL (ref 3.5–5.0)
Alkaline Phosphatase: 73 U/L (ref 38–126)
Anion gap: 9 (ref 5–15)
BUN: 13 mg/dL (ref 6–20)
CO2: 25 mmol/L (ref 22–32)
Calcium: 9.2 mg/dL (ref 8.9–10.3)
Chloride: 108 mmol/L (ref 98–111)
Creatinine, Ser: 0.86 mg/dL (ref 0.44–1.00)
GFR, Estimated: >60 mL/min (ref >60)
Glucose, Bld: 84 mg/dL (ref 70–99)
Potassium: 3.5 mmol/L (ref 3.5–5.1)
Sodium: 142 mmol/L (ref 135–145)
Total Bilirubin: 0.8 mg/dL (ref 0.3–1.2)
Total Protein: 8 g/dL (ref 6.5–8.1)

## 2021-11-13 LAB — URINALYSIS, ROUTINE W REFLEX MICROSCOPIC
Bacteria, UA: NONE SEEN
Bilirubin Urine: NEGATIVE
Glucose, UA: NEGATIVE mg/dL
Hgb urine dipstick: NEGATIVE
Ketones, ur: 20 mg/dL — AB
Nitrite: NEGATIVE
Protein, ur: 30 mg/dL — AB
Specific Gravity, Urine: 1.03 (ref 1.005–1.030)
pH: 5 (ref 5.0–8.0)

## 2021-11-13 LAB — CBC
HCT: 43.9 % (ref 36.0–46.0)
Hemoglobin: 13.8 g/dL (ref 12.0–15.0)
MCH: 28.9 pg (ref 26.0–34.0)
MCHC: 31.4 g/dL (ref 30.0–36.0)
MCV: 92 fL (ref 80.0–100.0)
Platelets: 316 10*3/uL (ref 150–400)
RBC: 4.77 MIL/uL (ref 3.87–5.11)
RDW: 13.6 % (ref 11.5–15.5)
WBC: 6.1 10*3/uL (ref 4.0–10.5)
nRBC: 0 % (ref 0.0–0.2)

## 2021-11-13 LAB — LIPASE, BLOOD: Lipase: 27 U/L (ref 11–51)

## 2021-11-13 MED ORDER — CIPROFLOXACIN HCL 500 MG PO TABS: 500.0000 mg | ORAL_TABLET | Freq: Two times a day (BID) | ORAL | 0 refills | Status: AC

## 2021-11-13 MED ORDER — IBUPROFEN 600 MG PO TABS: 600.0000 mg | ORAL_TABLET | Freq: Four times a day (QID) | ORAL | 0 refills | Status: AC | PRN

## 2021-11-13 MED ORDER — ONDANSETRON HCL 4 MG/2ML IJ SOLN
4.0000 mg | Freq: Once | INTRAMUSCULAR | Status: AC
  Administered 2021-11-13: 4 mg via INTRAVENOUS
  Filled 2021-11-13: qty 2

## 2021-11-13 MED ORDER — SODIUM CHLORIDE 0.9 % IV BOLUS
1000.0000 mL | Freq: Once | INTRAVENOUS | Status: AC
  Administered 2021-11-13: 1000 mL via INTRAVENOUS

## 2021-11-13 MED ORDER — IOHEXOL 300 MG/ML  SOLN
100.0000 mL | Freq: Once | INTRAMUSCULAR | Status: AC | PRN
  Administered 2021-11-13: 100 mL via INTRAVENOUS

## 2021-11-13 MED ORDER — ONDANSETRON 4 MG PO TBDP: 4.0000 mg | ORAL_TABLET | Freq: Three times a day (TID) | ORAL | 0 refills | Status: AC | PRN

## 2021-11-13 MED ORDER — KETOROLAC TROMETHAMINE 15 MG/ML IJ SOLN
15.0000 mg | Freq: Once | INTRAMUSCULAR | Status: AC
  Administered 2021-11-13: 15 mg via INTRAVENOUS
  Filled 2021-11-13: qty 1

## 2021-11-13 MED ORDER — HYDROMORPHONE HCL 1 MG/ML IJ SOLN
0.5000 mg | Freq: Once | INTRAMUSCULAR | Status: AC
  Administered 2021-11-13: 0.5 mg via INTRAVENOUS
  Filled 2021-11-13: qty 0.5

## 2021-11-13 MED ORDER — METRONIDAZOLE 500 MG PO TABS: 500.0000 mg | ORAL_TABLET | Freq: Three times a day (TID) | ORAL | 0 refills | Status: AC

## 2021-11-13 MED ORDER — OXYCODONE-ACETAMINOPHEN 5-325 MG PO TABS
1.0000 | ORAL_TABLET | ORAL | Status: DC | PRN
  Administered 2021-11-13: 1 via ORAL
  Filled 2021-11-13: qty 1

## 2021-11-13 NOTE — ED Triage Notes (Signed)
Patient c/o LLQ pain that will sometimes radiate across to right side. Pain has progressively gotten worse.   Denies trouble peeing.

## 2021-11-13 NOTE — Discharge Instructions (Addendum)
You need to do a liquid diet and bowel rest with low fiber foods.  Take the antibiotics.  Avoid alcohol and heavy exercise while on them.  Follow-up for colonoscopy in 6 weeks.  Return to the ER for fevers worsening pain or any other concerns You can take Tylenol 1 g every 8 hours with the prescribed medications.   IMPRESSION:  Moderate sigmoid diverticulitis. No evidence of perforation or  abscess.

## 2021-11-13 NOTE — ED Provider Notes (Addendum)
[facility] Provider Note    Event Date/Time   First MD Initiated Contact with Patient 11/13/21 1102     (approximate)   History   Abdominal Pain   HPI  Brenda Hoffman is a 41 y.o. female who comes in with left lower quadrant pain.  Patient reports 2 to 3 days of left lower quadrant pain that is intermittently radiating across her abdomen and severe in nature.  She denies ever having this before but is had significant abdominal history including bowel obstruction, gallbladder removal, appendix removal, hysterectomy so she was worried about a more serious issue.  She denies any issues with urinating.  Physical Exam   Triage Vital Signs: ED Triage Vitals  Enc Vitals Group     BP 11/13/21 0936 (!) 147/104     Pulse Rate 11/13/21 0936 82     Resp 11/13/21 0936 16     Temp 11/13/21 0936 98.5 F (36.9 C)     Temp Source 11/13/21 0936 Oral     SpO2 11/13/21 0936 100 %     Weight 11/13/21 0937 200 lb (90.7 kg)     Height 11/13/21 0937 5' 5.5" (1.664 m)     Head Circumference --      Peak Flow --      Pain Score 11/13/21 0936 9     Pain Loc --      Pain Edu? --      Excl. in [place]? --     Most recent vital signs: Vitals:   11/13/21 0936  BP: (!) 147/104  Pulse: 82  Resp: 16  Temp: 98.5 F (36.9 C)  SpO2: 100%     General: Awake, no distress.  CV:  Good peripheral perfusion.  Resp:  Normal effort.  Abd:  No distention.  Other:  Patient does appear in pain.  She is tender in her left lower quadrant.   ED Results / Procedures / Treatments   Labs (all labs ordered are listed, but only abnormal results are displayed) Labs Reviewed  LIPASE, BLOOD  COMPREHENSIVE METABOLIC PANEL  CBC  URINALYSIS, ROUTINE W REFLEX MICROSCOPIC      RADIOLOGY I have reviewed the CT personally and interpreted and + diverticulitis   PROCEDURES:  Critical Care performed: No  Procedures   MEDICATIONS ORDERED IN ED: Medications  HYDROmorphone  (DILAUDID) injection 0.5 mg (has no administration in time range)  ondansetron (ZOFRAN) injection 4 mg (has no administration in time range)  sodium chloride 0.9 % bolus 1,000 mL (has no administration in time range)     IMPRESSION / MDM / ASSESSMENT AND PLAN / ED COURSE  I reviewed the triage vital signs and the nursing notes.   Patient's presentation is most consistent with acute presentation with potential threat to life or bodily function.   Differential includes diverticulitis, abscess, bowel obstruction, kidney stone.  Will get CT and labs to further evaluate and give patient some IV pain medicine  CBC shows normal white count.  Lipase is normal.  CMP normal  IMPRESSION:  Moderate sigmoid diverticulitis. No evidence of perforation or  abscess.   Considered admission but given patient is well-appearing with normal vital signs and no evidence of abscess or perforation patient can be discharged home  Discussed with patient bowel rest with liquid diet.  We discussed antibiotics Cipro and Flagyl avoiding alcohol and heavy exercises, following up with GI for colonoscopy in 6 weeks.  We discussed return precautions in regards to worsening pain  and fevers she expressed understanding felt comfortable with discharge.   FINAL CLINICAL IMPRESSION(S) / ED DIAGNOSES   Final diagnoses:  Left lower quadrant abdominal pain  Diverticulitis     Rx / DC Orders   ED Discharge Orders          Ordered    ciprofloxacin (CIPRO) 500 MG tablet  2 times daily        11/13/21 1256    metroNIDAZOLE (FLAGYL) 500 MG tablet  3 times daily        11/13/21 1256             Note:  This document was prepared using Dragon voice recognition software and may include unintentional dictation errors.   Concha Se, MD 11/13/21 1302    Concha Se, MD 11/13/21 1304

## 2021-11-14 LAB — URINE CULTURE

## 2021-11-26 ENCOUNTER — Emergency Department
Admission: EM | Admit: 2021-11-26 | Discharge: 2021-11-26 | Disposition: A | Discharge status: Home / Self Care | Payer: 59 | Attending: Emergency Medicine | Admitting: Emergency Medicine

## 2021-11-26 ENCOUNTER — Emergency Department: Payer: 59

## 2021-11-26 DIAGNOSIS — K5900 Constipation, unspecified: Secondary | ICD-10-CM | POA: Diagnosis not present

## 2021-11-26 DIAGNOSIS — R109 Unspecified abdominal pain: Secondary | ICD-10-CM | POA: Diagnosis not present

## 2021-11-26 DIAGNOSIS — R1032 Left lower quadrant pain: Secondary | ICD-10-CM | POA: Diagnosis not present

## 2021-11-26 LAB — URINALYSIS, ROUTINE W REFLEX MICROSCOPIC
Bilirubin Urine: NEGATIVE
Glucose, UA: NEGATIVE mg/dL
Hgb urine dipstick: NEGATIVE
Ketones, ur: NEGATIVE mg/dL
Leukocytes,Ua: NEGATIVE
Nitrite: NEGATIVE
Protein, ur: NEGATIVE mg/dL
Specific Gravity, Urine: 1.025 (ref 1.005–1.030)
pH: 6 (ref 5.0–8.0)

## 2021-11-26 LAB — COMPREHENSIVE METABOLIC PANEL
ALT: 13 U/L (ref 0–44)
AST: 16 U/L (ref 15–41)
Albumin: 3.9 g/dL (ref 3.5–5.0)
Alkaline Phosphatase: 71 U/L (ref 38–126)
Anion gap: 4 — ABNORMAL LOW (ref 5–15)
BUN: 16 mg/dL (ref 6–20)
CO2: 29 mmol/L (ref 22–32)
Calcium: 9.6 mg/dL (ref 8.9–10.3)
Chloride: 109 mmol/L (ref 98–111)
Creatinine, Ser: 0.99 mg/dL (ref 0.44–1.00)
GFR, Estimated: >60 mL/min (ref >60)
Glucose, Bld: 129 mg/dL — ABNORMAL HIGH (ref 70–99)
Potassium: 4.1 mmol/L (ref 3.5–5.1)
Sodium: 142 mmol/L (ref 135–145)
Total Bilirubin: 0.5 mg/dL (ref 0.3–1.2)
Total Protein: 7.8 g/dL (ref 6.5–8.1)

## 2021-11-26 LAB — CBC
HCT: 42.3 % (ref 36.0–46.0)
Hemoglobin: 13.4 g/dL (ref 12.0–15.0)
MCH: 28.9 pg (ref 26.0–34.0)
MCHC: 31.7 g/dL (ref 30.0–36.0)
MCV: 91.2 fL (ref 80.0–100.0)
Platelets: 332 10*3/uL (ref 150–400)
RBC: 4.64 MIL/uL (ref 3.87–5.11)
RDW: 13.9 % (ref 11.5–15.5)
WBC: 5.1 10*3/uL (ref 4.0–10.5)
nRBC: 0 % (ref 0.0–0.2)

## 2021-11-26 LAB — LIPASE, BLOOD: Lipase: 59 U/L — ABNORMAL HIGH (ref 11–51)

## 2021-11-26 MED ORDER — IOHEXOL 300 MG/ML  SOLN
100.0000 mL | Freq: Once | INTRAMUSCULAR | Status: AC | PRN
  Administered 2021-11-26: 100 mL via INTRAVENOUS

## 2021-11-26 MED ORDER — POLYETHYLENE GLYCOL 3350 17 G PO PACK: 17.0000 g | PACK | Freq: Every day | ORAL | 0 refills | Status: AC

## 2021-11-26 MED ORDER — BISACODYL 10 MG RE SUPP: 10.0000 mg | RECTAL | 0 refills | Status: AC | PRN

## 2021-11-26 MED ORDER — DOCUSATE SODIUM 100 MG PO CAPS: 100.0000 mg | ORAL_CAPSULE | Freq: Two times a day (BID) | ORAL | 2 refills | Status: AC

## 2021-11-26 MED ORDER — MAGNESIUM CITRATE PO SOLN: 1.0000 | Freq: Once | ORAL | 1 refills | Status: AC

## 2021-11-26 NOTE — ED Provider Triage Note (Signed)
Emergency Medicine Provider Triage Evaluation Note  Brenda Hoffman , a 41 y.o. female  was evaluated in triage.  Pt complains of abdominal pain.  Questionable constipation versus diverticulitis.  Patient recently diagnosed with diverticulitis and took 2 antibiotics.  States she feels a lot of pressure in the perineum.  Review of Systems  Positive:  Negative:   Physical Exam  BP (!) 151/117 (BP Location: Left Arm)   Pulse 72   Temp 98.1 F (36.7 C) (Oral)   Resp 18   Wt 90.7 kg   SpO2 95%   BMI 32.78 kg/m  Gen:   Awake, no distress   Resp:  Normal effort  MSK:   Moves extremities without difficulty Other:    Medical Decision Making  Medically screening exam initiated at 5:05 PM.  Appropriate orders placed.  Brenda Hoffman was informed that the remainder of the evaluation will be completed by another provider, this initial triage assessment does not replace that evaluation, and the importance of remaining in the ED until their evaluation is complete.     Versie Starks, PA-C 11/26/21 1705

## 2021-11-26 NOTE — ED Triage Notes (Signed)
Pt sts that she has been constipated for the last couple of days. Pt sts that she feels pressure not in the rectum but peri rectal area.

## 2021-11-26 NOTE — ED Provider Notes (Signed)
[facility] Provider Note    Event Date/Time   First MD Initiated Contact with Patient 11/26/21 1802     (approximate)   History   Constipation   HPI  Brenda Hoffman is a 41 y.o. female who presents with complaints of abdominal pain.  Patient reports she was diagnosed with diverticulitis about a week ago.  Review of records demonstrates that was on 19 October.  She completed her course of antibiotics but reports she has not really improved and symptoms seem worse over the last 2 days in her lower abdomen.  She has the same pain that she did before.  No vomiting.  No fevers reported.     Physical Exam   Triage Vital Signs: ED Triage Vitals  Enc Vitals Group     BP 11/26/21 1700 (!) 151/117     Pulse Rate 11/26/21 1700 72     Resp 11/26/21 1700 18     Temp 11/26/21 1700 98.1 F (36.7 C)     Temp Source 11/26/21 1700 Oral     SpO2 11/26/21 1700 95 %     Weight 11/26/21 1658 90.7 kg (200 lb)     Height --      Head Circumference --      Peak Flow --      Pain Score 11/26/21 1657 10     Pain Loc --      Pain Edu? --      Excl. in [place]? --     Most recent vital signs: Vitals:   11/26/21 1857 11/26/21 2014  BP: (!) 153/98 (!) 149/78  Pulse: 63 78  Resp: 20 20  Temp:  98.3 F (36.8 C)  SpO2: 98% 97%     General: Awake, no distress.  CV:  Good peripheral perfusion.  Resp:  Normal effort.  Abd:  No distention.  Tenderness in the lower abdomen, left lower quadrant and right lower quadrant, no guarding Other:     ED Results / Procedures / Treatments   Labs (all labs ordered are listed, but only abnormal results are displayed) Labs Reviewed  LIPASE, BLOOD - Abnormal; Notable for the following components:      Result Value   Lipase 59 (*)    All other components within normal limits  COMPREHENSIVE METABOLIC PANEL - Abnormal; Notable for the following components:   Glucose, Bld 129 (*)    Anion gap 4 (*)    All other components  within normal limits  URINALYSIS, ROUTINE W REFLEX MICROSCOPIC - Abnormal; Notable for the following components:   Color, Urine YELLOW (*)    APPearance CLEAR (*)    All other components within normal limits  CBC  POC URINE PREG, ED     EKG     RADIOLOGY CT abdomen pelvis pending X-ray abdomen viewed interpreted by me no fecal impaction  PROCEDURES:  Critical Care performed:   Procedures   MEDICATIONS ORDERED IN ED: Medications  iohexol (OMNIPAQUE) 300 MG/ML solution 100 mL (100 mLs Intravenous Contrast Given 11/26/21 1919)     IMPRESSION / MDM / ASSESSMENT AND PLAN / ED COURSE  I reviewed the triage vital signs and the nursing notes. Patient's presentation is most consistent with acute presentation with potential threat to life or bodily function.   Patient presents with abdominal pain as detailed above.  Differential includes diverticulitis, diverticular abscess, constipation  Given no significant improvement over the last 10 days or so concern for possible diverticular abscess, we  will need to reimage.  Lab work is reviewed and is reassuring, normal white blood cell count.  Patient is afebrile.  Repeat CT scan is negative for diverticulitis or abscess.  Patient is reassured by this, we will treat her for constipation, no indication for admission at this time.  Prescriptions provided       FINAL CLINICAL IMPRESSION(S) / ED DIAGNOSES   Final diagnoses:  Constipation, unspecified constipation type     Rx / DC Orders   ED Discharge Orders          Ordered    polyethylene glycol (MIRALAX) 17 g packet  Daily        11/26/21 1954    bisacodyl (DULCOLAX) 10 MG suppository  As needed        11/26/21 1954    magnesium citrate SOLN   Once        11/26/21 1954    docusate sodium (COLACE) 100 MG capsule  2 times daily        11/26/21 1954             Note:  This document was prepared using Dragon voice recognition software and may include  unintentional dictation errors.   Jene Every, MD 11/26/21 2109

## 2022-03-19 DIAGNOSIS — J069 Acute upper respiratory infection, unspecified: Secondary | ICD-10-CM | POA: Diagnosis not present

## 2022-04-15 DIAGNOSIS — M5417 Radiculopathy, lumbosacral region: Secondary | ICD-10-CM | POA: Diagnosis not present

## 2022-04-15 DIAGNOSIS — S335XXA Sprain of ligaments of lumbar spine, initial encounter: Secondary | ICD-10-CM | POA: Diagnosis not present

## 2022-05-18 DIAGNOSIS — M5416 Radiculopathy, lumbar region: Secondary | ICD-10-CM | POA: Diagnosis not present

## 2022-05-18 DIAGNOSIS — M5441 Lumbago with sciatica, right side: Secondary | ICD-10-CM | POA: Diagnosis not present

## 2022-05-18 DIAGNOSIS — M542 Cervicalgia: Secondary | ICD-10-CM | POA: Diagnosis not present

## 2022-05-18 DIAGNOSIS — M5412 Radiculopathy, cervical region: Secondary | ICD-10-CM | POA: Diagnosis not present

## 2022-05-28 DIAGNOSIS — M6281 Muscle weakness (generalized): Secondary | ICD-10-CM | POA: Diagnosis not present

## 2022-05-28 DIAGNOSIS — M5412 Radiculopathy, cervical region: Secondary | ICD-10-CM | POA: Diagnosis not present

## 2022-06-03 DIAGNOSIS — M5412 Radiculopathy, cervical region: Secondary | ICD-10-CM | POA: Diagnosis not present

## 2022-06-03 DIAGNOSIS — M6281 Muscle weakness (generalized): Secondary | ICD-10-CM | POA: Diagnosis not present

## 2022-06-05 DIAGNOSIS — M5412 Radiculopathy, cervical region: Secondary | ICD-10-CM | POA: Diagnosis not present

## 2022-06-05 DIAGNOSIS — M6281 Muscle weakness (generalized): Secondary | ICD-10-CM | POA: Diagnosis not present

## 2022-06-09 DIAGNOSIS — M6281 Muscle weakness (generalized): Secondary | ICD-10-CM | POA: Diagnosis not present

## 2022-06-09 DIAGNOSIS — M5412 Radiculopathy, cervical region: Secondary | ICD-10-CM | POA: Diagnosis not present

## 2022-06-24 ENCOUNTER — Other Ambulatory Visit: Payer: Self-pay | Admitting: Orthopedic Surgery

## 2022-06-24 DIAGNOSIS — M5442 Lumbago with sciatica, left side: Secondary | ICD-10-CM | POA: Diagnosis not present

## 2022-06-24 DIAGNOSIS — M5441 Lumbago with sciatica, right side: Secondary | ICD-10-CM | POA: Diagnosis not present

## 2022-06-24 DIAGNOSIS — M5412 Radiculopathy, cervical region: Secondary | ICD-10-CM | POA: Diagnosis not present

## 2022-06-24 DIAGNOSIS — M6281 Muscle weakness (generalized): Secondary | ICD-10-CM | POA: Diagnosis not present

## 2022-06-24 DIAGNOSIS — M5416 Radiculopathy, lumbar region: Secondary | ICD-10-CM | POA: Diagnosis not present

## 2022-07-01 DIAGNOSIS — M5442 Lumbago with sciatica, left side: Secondary | ICD-10-CM | POA: Diagnosis not present

## 2022-07-01 DIAGNOSIS — M6281 Muscle weakness (generalized): Secondary | ICD-10-CM | POA: Diagnosis not present

## 2022-07-04 ENCOUNTER — Other Ambulatory Visit: Payer: 59

## 2022-07-07 DIAGNOSIS — M6281 Muscle weakness (generalized): Secondary | ICD-10-CM | POA: Diagnosis not present

## 2022-07-07 DIAGNOSIS — M5442 Lumbago with sciatica, left side: Secondary | ICD-10-CM | POA: Diagnosis not present

## 2022-07-09 ENCOUNTER — Ambulatory Visit
Admission: RE | Admit: 2022-07-09 | Discharge: 2022-07-09 | Disposition: A | Discharge status: Home / Self Care | Payer: 59 | Source: Ambulatory Visit | Attending: Orthopedic Surgery | Admitting: Orthopedic Surgery

## 2022-07-09 DIAGNOSIS — M545 Low back pain, unspecified: Secondary | ICD-10-CM | POA: Diagnosis not present

## 2022-07-09 DIAGNOSIS — M5441 Lumbago with sciatica, right side: Secondary | ICD-10-CM

## 2022-07-13 DIAGNOSIS — M6281 Muscle weakness (generalized): Secondary | ICD-10-CM | POA: Diagnosis not present

## 2022-07-13 DIAGNOSIS — M5442 Lumbago with sciatica, left side: Secondary | ICD-10-CM | POA: Diagnosis not present

## 2022-07-29 DIAGNOSIS — M5412 Radiculopathy, cervical region: Secondary | ICD-10-CM | POA: Diagnosis not present

## 2022-07-29 DIAGNOSIS — M5441 Lumbago with sciatica, right side: Secondary | ICD-10-CM | POA: Diagnosis not present

## 2022-07-29 DIAGNOSIS — M542 Cervicalgia: Secondary | ICD-10-CM | POA: Diagnosis not present

## 2022-07-29 DIAGNOSIS — M5416 Radiculopathy, lumbar region: Secondary | ICD-10-CM | POA: Diagnosis not present

## 2022-08-26 DIAGNOSIS — M5412 Radiculopathy, cervical region: Secondary | ICD-10-CM | POA: Diagnosis not present

## 2022-08-26 DIAGNOSIS — M5441 Lumbago with sciatica, right side: Secondary | ICD-10-CM | POA: Diagnosis not present

## 2022-08-26 DIAGNOSIS — M5416 Radiculopathy, lumbar region: Secondary | ICD-10-CM | POA: Diagnosis not present

## 2022-09-01 DIAGNOSIS — M6281 Muscle weakness (generalized): Secondary | ICD-10-CM | POA: Diagnosis not present

## 2022-09-01 DIAGNOSIS — M5412 Radiculopathy, cervical region: Secondary | ICD-10-CM | POA: Diagnosis not present

## 2023-01-16 ENCOUNTER — Emergency Department (HOSPITAL_BASED_OUTPATIENT_CLINIC_OR_DEPARTMENT_OTHER): Payer: Self-pay | Admitting: Radiology

## 2023-01-16 ENCOUNTER — Other Ambulatory Visit: Payer: Self-pay

## 2023-01-16 ENCOUNTER — Emergency Department (HOSPITAL_BASED_OUTPATIENT_CLINIC_OR_DEPARTMENT_OTHER)
Admission: EM | Admit: 2023-01-16 | Discharge: 2023-01-16 | Disposition: A | Discharge status: Home / Self Care | Payer: Self-pay | Attending: Emergency Medicine | Admitting: Emergency Medicine

## 2023-01-16 ENCOUNTER — Encounter (HOSPITAL_BASED_OUTPATIENT_CLINIC_OR_DEPARTMENT_OTHER): Payer: Self-pay | Admitting: Emergency Medicine

## 2023-01-16 DIAGNOSIS — M25562 Pain in left knee: Secondary | ICD-10-CM | POA: Diagnosis not present

## 2023-01-16 DIAGNOSIS — M79641 Pain in right hand: Secondary | ICD-10-CM | POA: Insufficient documentation

## 2023-01-16 DIAGNOSIS — R079 Chest pain, unspecified: Secondary | ICD-10-CM | POA: Insufficient documentation

## 2023-01-16 DIAGNOSIS — M545 Low back pain, unspecified: Secondary | ICD-10-CM | POA: Insufficient documentation

## 2023-01-16 DIAGNOSIS — Y92410 Unspecified street and highway as the place of occurrence of the external cause: Secondary | ICD-10-CM | POA: Insufficient documentation

## 2023-01-16 MED ORDER — ACETAMINOPHEN 325 MG PO TABS
650.0000 mg | ORAL_TABLET | Freq: Once | ORAL | Status: AC
  Administered 2023-01-16: 650 mg via ORAL
  Filled 2023-01-16: qty 2

## 2023-01-16 MED ORDER — CYCLOBENZAPRINE HCL 10 MG PO TABS: 10.0000 mg | ORAL_TABLET | Freq: Two times a day (BID) | ORAL | 0 refills | Status: AC | PRN

## 2023-01-16 MED ORDER — CYCLOBENZAPRINE HCL 10 MG PO TABS
10.0000 mg | ORAL_TABLET | Freq: Once | ORAL | Status: AC
  Administered 2023-01-16: 10 mg via ORAL
  Filled 2023-01-16: qty 1

## 2023-01-16 MED ORDER — IBUPROFEN 400 MG PO TABS
400.0000 mg | ORAL_TABLET | Freq: Once | ORAL | Status: AC
  Administered 2023-01-16: 400 mg via ORAL
  Filled 2023-01-16: qty 1
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
## 2023-01-16 NOTE — Discharge Instructions (Signed)
All the x-rays are normal today.  However you will be very sore over the next few days.  Use hot showers, heating pads and muscle rubs as well as 2 Tylenol and 2 ibuprofen together every 6 hours as needed for pain.  You can use the muscle relaxer also as needed for stiffness but it may make you drowsy.

## 2023-01-16 NOTE — ED Notes (Signed)
Patient transported to X-ray 

## 2023-01-16 NOTE — ED Notes (Signed)
Pt refused Hcg due to Hx of Hysterectomy.
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
## 2023-01-16 NOTE — ED Notes (Signed)
Upreg ordered. Patient has had hysterectomy. Messaged provider and spoke with xray. Will come get patient shortly for her xrays.
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
## 2023-01-22 ENCOUNTER — Emergency Department (HOSPITAL_COMMUNITY)
Admission: EM | Admit: 2023-01-22 | Discharge: 2023-01-22 | Disposition: A | Discharge status: Home / Self Care | Payer: Self-pay | Attending: Emergency Medicine | Admitting: Emergency Medicine

## 2023-01-22 ENCOUNTER — Encounter (HOSPITAL_COMMUNITY): Payer: Self-pay

## 2023-01-22 ENCOUNTER — Emergency Department (HOSPITAL_COMMUNITY)
Admission: EM | Admit: 2023-01-22 | Discharge: 2023-01-22 | Discharge status: Against Medical Advice | Payer: No Typology Code available for payment source

## 2023-01-22 ENCOUNTER — Emergency Department (HOSPITAL_COMMUNITY): Payer: Self-pay

## 2023-01-22 ENCOUNTER — Other Ambulatory Visit: Payer: Self-pay

## 2023-01-22 DIAGNOSIS — S2232XA Fracture of one rib, left side, initial encounter for closed fracture: Secondary | ICD-10-CM | POA: Insufficient documentation

## 2023-01-22 DIAGNOSIS — J449 Chronic obstructive pulmonary disease, unspecified: Secondary | ICD-10-CM | POA: Diagnosis not present

## 2023-01-22 DIAGNOSIS — R911 Solitary pulmonary nodule: Secondary | ICD-10-CM | POA: Insufficient documentation

## 2023-01-22 DIAGNOSIS — T148XXA Other injury of unspecified body region, initial encounter: Secondary | ICD-10-CM

## 2023-01-22 DIAGNOSIS — R918 Other nonspecific abnormal finding of lung field: Secondary | ICD-10-CM

## 2023-01-22 DIAGNOSIS — R0789 Other chest pain: Secondary | ICD-10-CM | POA: Diagnosis present

## 2023-01-22 DIAGNOSIS — E041 Nontoxic single thyroid nodule: Secondary | ICD-10-CM | POA: Diagnosis not present

## 2023-01-22 DIAGNOSIS — Y92410 Unspecified street and highway as the place of occurrence of the external cause: Secondary | ICD-10-CM | POA: Diagnosis not present

## 2023-01-22 MED ORDER — OXYCODONE-ACETAMINOPHEN 5-325 MG PO TABS
1.0000 | ORAL_TABLET | Freq: Once | ORAL | Status: AC
  Administered 2023-01-22: 1 via ORAL
  Filled 2023-01-22: qty 1

## 2023-01-22 MED ORDER — LIDOCAINE 5 % EX PTCH: 1.0000 | MEDICATED_PATCH | CUTANEOUS | 0 refills | Status: AC

## 2023-01-22 MED ORDER — OXYCODONE-ACETAMINOPHEN 5-325 MG PO TABS: 1.0000 | ORAL_TABLET | Freq: Four times a day (QID) | ORAL | 0 refills | Status: DC | PRN

## 2023-01-22 NOTE — Discharge Instructions (Addendum)
Please use Tylenol or ibuprofen for pain.  You may use 600 mg ibuprofen every 6 hours or 1000 mg of Tylenol every 6 hours.  You may choose to alternate between the 2.  This would be most effective.  Not to exceed 4 g of Tylenol within 24 hours.  Not to exceed 3200 mg ibuprofen 24 hours.  " 2. Solid pulmonary nodules in the lingula measuring 5 x 5 mm and  right lower lobe measuring 6 x 6 mm. Non-contrast chest CT at 3-6  months is recommended. If the nodules are stable at time of repeat  CT, then future CT at 18-24 months (from today's scan) is considered  optional for low-risk patients, but is recommended for high-risk  patients. This recommendation follows the consensus statement:  Guidelines for Management of Incidental Pulmonary Nodules Detected  on CT Images: From the Fleischner Society 2017; Radiology 2017;  284:228-243.  3. Right thyroid hypodensity measures 1.5 cm. Recommend thyroid US  (ref: J Am Coll Radiol. 2015 Feb;12(2): 143-50)  "  Please follow-up with a primary care doctor to schedule a CT in 3 to 6 months for the lung nodules that were noted, as well as an ultrasound of the thyroid for the small thyroid nodule that was noted.  You can place the lidocaine patch directly where it hurts on the chest wall, please use the incentive spirometer that we have prescribed every hour while you are awake, I would expect that it may take 6 weeks for most of the chest pain to resolve and the fracture to heal.  If you have severe worsening pain, difficulty breathing please return to the emergency department for further evaluation.  If you take the stronger narcotic pain medication make sure that you are taking some MiraLAX or other anticonstipation medication at the same time.
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
## 2023-01-22 NOTE — ED Notes (Signed)
Incentive Spirometry given and education done. Patient verbalized understanding.

## 2023-01-22 NOTE — ED Notes (Signed)
Pt to CT
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
## 2023-01-22 NOTE — ED Notes (Signed)
Pt called three times
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
## 2023-01-30 ENCOUNTER — Emergency Department (HOSPITAL_COMMUNITY)
Admission: EM | Admit: 2023-01-30 | Discharge: 2023-01-31 | Disposition: A | Discharge status: Home / Self Care | Payer: Self-pay | Attending: Emergency Medicine | Admitting: Emergency Medicine

## 2023-01-30 ENCOUNTER — Encounter (HOSPITAL_COMMUNITY): Payer: Self-pay | Admitting: Emergency Medicine

## 2023-01-30 ENCOUNTER — Emergency Department (HOSPITAL_COMMUNITY): Payer: Self-pay

## 2023-01-30 ENCOUNTER — Other Ambulatory Visit: Payer: Self-pay

## 2023-01-30 DIAGNOSIS — S2232XD Fracture of one rib, left side, subsequent encounter for fracture with routine healing: Secondary | ICD-10-CM | POA: Insufficient documentation

## 2023-01-30 DIAGNOSIS — J449 Chronic obstructive pulmonary disease, unspecified: Secondary | ICD-10-CM | POA: Insufficient documentation

## 2023-01-30 DIAGNOSIS — S299XXA Unspecified injury of thorax, initial encounter: Secondary | ICD-10-CM | POA: Diagnosis present
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
## 2023-01-31 MED ORDER — OXYCODONE HCL 5 MG PO TABS: 2.5000 mg | ORAL_TABLET | Freq: Four times a day (QID) | ORAL | 0 refills | Status: AC | PRN

## 2023-01-31 MED ORDER — KETOROLAC TROMETHAMINE 60 MG/2ML IM SOLN
30.0000 mg | Freq: Once | INTRAMUSCULAR | Status: AC
  Administered 2023-01-31: 30 mg via INTRAMUSCULAR
  Filled 2023-01-31: qty 2

## 2023-01-31 MED ORDER — ACETAMINOPHEN 500 MG PO TABS
1000.0000 mg | ORAL_TABLET | Freq: Once | ORAL | Status: AC
  Administered 2023-01-31: 1000 mg via ORAL
  Filled 2023-01-31: qty 2

## 2023-01-31 MED ORDER — OXYCODONE HCL 5 MG PO TABS: 2.5000 mg | ORAL_TABLET | Freq: Four times a day (QID) | ORAL | 0 refills | Status: DC | PRN

## 2023-01-31 NOTE — Discharge Instructions (Addendum)
 Incentive spirometer: Use frequently to prevent pneumonia.  Recommended usage: 10 deep inhalations through spirometer every 2-3 hours for 7-10 days.  For pain control you may take 1000 mg of Tylenol  and 600 mg of Ibuprofen  every 8 hours scheduled.  In addition you can take 0.5 to 1 tablet of Oxycodone  every 6 hours as needed for pain not controlled with the scheduled Tylenol .
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
## 2023-07-23 ENCOUNTER — Emergency Department
Admission: EM | Admit: 2023-07-23 | Discharge: 2023-07-23 | Disposition: A | Discharge status: Home / Self Care | Payer: Self-pay | Attending: Emergency Medicine | Admitting: Emergency Medicine

## 2023-07-23 ENCOUNTER — Emergency Department: Payer: Self-pay

## 2023-07-23 ENCOUNTER — Other Ambulatory Visit: Payer: Self-pay

## 2023-07-23 ENCOUNTER — Encounter: Payer: Self-pay | Admitting: Intensive Care

## 2023-07-23 DIAGNOSIS — M25532 Pain in left wrist: Secondary | ICD-10-CM | POA: Diagnosis present

## 2023-07-23 DIAGNOSIS — M779 Enthesopathy, unspecified: Secondary | ICD-10-CM | POA: Diagnosis not present

## 2023-07-23 NOTE — ED Notes (Signed)
 X-Ray at bedside.
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
## 2023-07-23 NOTE — ED Provider Notes (Signed)
 [facility] Provider Note    Event Date/Time   First MD Initiated Contact with Patient 07/23/23 [phone]     (approximate)   History   Hand Injury   HPI  GREENLEIGH KAUTH is a 43 y.o. female who presents today for evaluation of left wrist pain.  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  She reports that she had swelling right away, however this has improved over the past week with ice and ibuprofen .  However, she still has pain over her radial aspect of her wrist.  She is able to move and use her wrist, though has pain when applying pressure to the area.  No paresthesias.  No weakness.  No previous injuries.  There are no active problems to display for this patient.         Physical Exam   Triage Vital Signs: ED Triage Vitals [07/23/23 0923]  Encounter Vitals Group     BP 137/89     Girls Systolic BP Percentile      Girls Diastolic BP Percentile      Boys Systolic BP Percentile      Boys Diastolic BP Percentile      Pulse Rate 64     Resp 18     Temp 98.6 F (37 C)     Temp Source Oral     SpO2 99 %     Weight 175 lb (79.4 kg)     Height 5' 2 (1.575 m)     Head Circumference      Peak Flow      Pain Score 8     Pain Loc      Pain Education      Exclude from Growth Chart     Most recent vital signs: Vitals:   07/23/23 0923  BP: 137/89  Pulse: 64  Resp: 18  Temp: 98.6 F (37 C)  SpO2: 99%    Physical Exam Vitals and nursing note reviewed.  Constitutional:      General: Awake and alert. No acute distress.    Appearance: Normal appearance. The patient is normal weight.  HENT:     Head: Normocephalic and atraumatic.     Mouth: Mucous membranes are moist.  Eyes:     General: PERRL. Normal EOMs        Right eye: No discharge.        Left eye: No discharge.     Conjunctiva/sclera: Conjunctivae normal.  Cardiovascular:     Rate and Rhythm: Normal rate and regular rhythm.      Pulses: Normal pulses.  Pulmonary:     Effort: Pulmonary effort is normal. No respiratory distress.     Breath sounds: Normal breath sounds.  Abdominal:     Abdomen is soft. There is no abdominal tenderness. No rebound or guarding. No distention. Musculoskeletal:        General: No swelling. Normal range of motion.     Cervical back: Normal range of motion and neck supple.  Left wrist without any obvious deformity, erythema, or swelling noted.  There is tenderness over the ulnar styloid.  Able to flex and extend all fingers against resistance, able to abduct and adduct thumb against resistance.  Positive Finkelstein test.  No open wounds. Skin:    General: Skin is warm and dry.     Capillary Refill: Capillary refill takes less than 2 seconds.  Findings: No rash.  Neurological:     Mental Status: The patient is awake and alert.      ED Results / Procedures / Treatments   Labs (all labs ordered are listed, but only abnormal results are displayed) Labs Reviewed - No data to display   EKG     RADIOLOGY I independently reviewed and interpreted imaging and agree with radiologists findings.     PROCEDURES:  Critical Care performed:   Procedures   MEDICATIONS ORDERED IN ED: Medications - No data to display   IMPRESSION / MDM / ASSESSMENT AND PLAN / ED COURSE  I reviewed the triage vital signs and the nursing notes.   Differential diagnosis includes, but is not limited to, decquervains, tendinitis, fracture, contusion, strain/sprain.  Patient is awake and alert, hemodynamically stable and afebrile.  She is neurovascularly intact.  There is no obvious deformity.  There is no evidence of infection on exam, no swelling, erythema, or wounds noted.  X-ray obtained is negative for any acute bony injury.  No snuffbox tenderness.  She does have tenderness along her ulnar styloid and pain with Terrilee test suggestive of a possible dequervains tenosynovitis.  She was placed  in a thumb spica splint for extra support.  We discussed recommendations for continued rest, ice, elevation.  Patient understands and agrees with plan.  She was discharged in stable condition.  She was given a work note as requested.   Patient's presentation is most consistent with acute complicated illness / injury requiring diagnostic workup.      FINAL CLINICAL IMPRESSION(S) / ED DIAGNOSES   Final diagnoses:  Tendonitis     Rx / DC Orders   ED Discharge Orders     None        Note:  This document was prepared using Dragon voice recognition software and may include unintentional dictation errors.   Kerstyn Coryell E, PA-C 07/23/23 1329    Suzanne Kirsch, MD 07/23/23 1546

## 2023-07-23 NOTE — Discharge Instructions (Signed)
 Rest, ice, elevate your hand/wrist. Use the splint when using your hand/wrist, remove when not. Please return for any new, worsening, or change in symptoms or other concerns.

## 2023-07-23 NOTE — ED Notes (Signed)
 Thumb spica applied with no issue. This RN reviewed paperwork with pt. No further complaints or questions. Pt ambulated to lobby.

## 2023-11-11 ENCOUNTER — Other Ambulatory Visit: Payer: Self-pay

## 2023-11-11 ENCOUNTER — Emergency Department

## 2023-11-11 ENCOUNTER — Emergency Department
Admission: EM | Admit: 2023-11-11 | Discharge: 2023-11-11 | Disposition: A | Discharge status: Home / Self Care | Attending: Emergency Medicine | Admitting: Emergency Medicine

## 2023-11-11 DIAGNOSIS — J449 Chronic obstructive pulmonary disease, unspecified: Secondary | ICD-10-CM | POA: Diagnosis not present

## 2023-11-11 DIAGNOSIS — K529 Noninfective gastroenteritis and colitis, unspecified: Secondary | ICD-10-CM | POA: Insufficient documentation

## 2023-11-11 DIAGNOSIS — R1032 Left lower quadrant pain: Secondary | ICD-10-CM | POA: Diagnosis present

## 2023-11-11 LAB — URINALYSIS, ROUTINE W REFLEX MICROSCOPIC
Bacteria, UA: NONE SEEN
Bilirubin Urine: NEGATIVE
Glucose, UA: NEGATIVE mg/dL
Hgb urine dipstick: NEGATIVE
Ketones, ur: NEGATIVE mg/dL
Nitrite: NEGATIVE
Protein, ur: 30 mg/dL — AB
Specific Gravity, Urine: 1.031 — ABNORMAL HIGH (ref 1.005–1.030)
WBC, UA: >50 WBC/hpf (ref 0–5)
pH: 5 (ref 5.0–8.0)

## 2023-11-11 LAB — LIPASE, BLOOD: Lipase: 24 U/L (ref 11–51)

## 2023-11-11 LAB — COMPREHENSIVE METABOLIC PANEL WITH GFR
ALT: 12 U/L (ref 0–44)
AST: 24 U/L (ref 15–41)
Albumin: 3.3 g/dL — ABNORMAL LOW (ref 3.5–5.0)
Alkaline Phosphatase: 54 U/L (ref 38–126)
Anion gap: 12 (ref 5–15)
BUN: 11 mg/dL (ref 6–20)
CO2: 26 mmol/L (ref 22–32)
Calcium: 9.1 mg/dL (ref 8.9–10.3)
Chloride: 105 mmol/L (ref 98–111)
Creatinine, Ser: 0.81 mg/dL (ref 0.44–1.00)
GFR, Estimated: >60 mL/min (ref >60)
Glucose, Bld: 139 mg/dL — ABNORMAL HIGH (ref 70–99)
Potassium: 3.4 mmol/L — ABNORMAL LOW (ref 3.5–5.1)
Sodium: 143 mmol/L (ref 135–145)
Total Bilirubin: 0.6 mg/dL (ref 0.0–1.2)
Total Protein: 6.5 g/dL (ref 6.5–8.1)

## 2023-11-11 LAB — CBC
HCT: 46 % (ref 36.0–46.0)
Hemoglobin: 15.2 g/dL — ABNORMAL HIGH (ref 12.0–15.0)
MCH: 30.1 pg (ref 26.0–34.0)
MCHC: 33 g/dL (ref 30.0–36.0)
MCV: 91.1 fL (ref 80.0–100.0)
Platelets: 298 K/uL (ref 150–400)
RBC: 5.05 MIL/uL (ref 3.87–5.11)
RDW: 14.3 % (ref 11.5–15.5)
WBC: 6 K/uL (ref 4.0–10.5)
nRBC: 0 % (ref 0.0–0.2)

## 2023-11-11 MED ORDER — MORPHINE SULFATE (PF) 4 MG/ML IV SOLN
4.0000 mg | Freq: Once | INTRAVENOUS | Status: AC
  Administered 2023-11-11: 4 mg via INTRAVENOUS
  Filled 2023-11-11: qty 1

## 2023-11-11 MED ORDER — IOHEXOL 300 MG/ML  SOLN
100.0000 mL | Freq: Once | INTRAMUSCULAR | Status: AC | PRN
  Administered 2023-11-11: 100 mL via INTRAVENOUS

## 2023-11-11 MED ORDER — ONDANSETRON HCL 4 MG/2ML IJ SOLN
4.0000 mg | Freq: Once | INTRAMUSCULAR | Status: AC
  Administered 2023-11-11: 4 mg via INTRAVENOUS
  Filled 2023-11-11: qty 2

## 2023-11-11 MED ORDER — DICYCLOMINE HCL 10 MG PO CAPS
10.0000 mg | ORAL_CAPSULE | Freq: Once | ORAL | Status: AC
  Administered 2023-11-11: 10 mg via ORAL
  Filled 2023-11-11: qty 1

## 2023-11-11 MED ORDER — FLUCONAZOLE 150 MG PO TABS: 150.0000 mg | ORAL_TABLET | ORAL | 0 refills | Status: AC | PRN

## 2023-11-11 MED ORDER — KETOROLAC TROMETHAMINE 15 MG/ML IJ SOLN: 15.0000 mg | Freq: Once | INTRAMUSCULAR | Status: DC

## 2023-11-11 MED ORDER — CIPROFLOXACIN HCL 500 MG PO TABS: 500.0000 mg | ORAL_TABLET | Freq: Two times a day (BID) | ORAL | 0 refills | Status: AC

## 2023-11-11 MED ORDER — DICYCLOMINE HCL 10 MG PO CAPS: 10.0000 mg | ORAL_CAPSULE | Freq: Three times a day (TID) | ORAL | 0 refills | Status: AC

## 2023-11-11 MED ORDER — METRONIDAZOLE 500 MG PO TABS: 500.0000 mg | ORAL_TABLET | Freq: Two times a day (BID) | ORAL | 0 refills | Status: AC

## 2023-11-11 MED ORDER — SODIUM CHLORIDE 0.9 % IV BOLUS
1000.0000 mL | Freq: Once | INTRAVENOUS | Status: AC
  Administered 2023-11-11: 1000 mL via INTRAVENOUS

## 2023-11-11 NOTE — Discharge Instructions (Addendum)
 Your evaluated in the ED for lower abdominal pain.  Your lab work is reassuring.  Your CT abdomen pelvis shows colitis in which we have prescribed antibiotics.  You are encouraged to adopt a clear liquid and bland diet until symptoms improved.  Please follow-up with up with GI.   If any new or worsening symptoms occur please return to ED for further evaluation.

## 2023-11-11 NOTE — ED Notes (Signed)
 See triage note  Presents with some abd pain  States is in lower abd  with some n/v  Started this am Low grade temp noted on arrival

## 2023-11-11 NOTE — ED Triage Notes (Signed)
 Patient states lower abdominal pain, N/V/D that started last night.

## 2023-11-11 NOTE — ED Provider Notes (Signed)
 [facility] Emergency Department Provider Note     Event Date/Time   First MD Initiated Contact with Patient 11/11/23 1339     (approximate)   History   Abdominal Pain   HPI  Brenda Hoffman is a 43 y.o. female with a past medical history of COPD presents to the ED for evaluation of left lower quadrant abdominal pain x 2 days with associated nausea, vomiting and diarrhea.  Abdominal surgical history include hysterectomy, appendectomy and oophorectomy.  Reports history of incisional hernias.  Denies fever, hematemesis, rectal bleeding, dysuria.     Physical Exam   Triage Vital Signs: ED Triage Vitals  Encounter Vitals Group     BP 11/11/23 1224 (!) 165/94     Girls Systolic BP Percentile --      Girls Diastolic BP Percentile --      Boys Systolic BP Percentile --      Boys Diastolic BP Percentile --      Pulse Rate 11/11/23 1225 (!) 112     Resp 11/11/23 1224 18     Temp 11/11/23 1224 99.2 F (37.3 C)     Temp Source 11/11/23 1224 Oral     SpO2 11/11/23 1224 98 %     Weight 11/11/23 1225 184 lb (83.5 kg)     Height 11/11/23 1225 5' 5 (1.651 m)     Head Circumference --      Peak Flow --      Pain Score 11/11/23 1223 10     Pain Loc --      Pain Education --      Exclude from Growth Chart --     Most recent vital signs: Vitals:   11/11/23 1225 11/11/23 1648  BP:  (!) 160/90  Pulse: (!) 112 100  Resp:  18  Temp:  98.4 F (36.9 C)  SpO2:  98%    General Awake, no distress.  HEENT NCAT.  CV:  Good peripheral perfusion.  RESP:  Normal effort.  ABD:  No distention.  Soft, localized tenderness to left lower quadrant.  No masses palpated.  ED Results / Procedures / Treatments   Labs (all labs ordered are listed, but only abnormal results are displayed) Labs Reviewed  COMPREHENSIVE METABOLIC PANEL WITH GFR - Abnormal; Notable for the following components:      Result Value   Potassium 3.4 (*)    Glucose, Bld 139 (*)     Albumin 3.3 (*)    All other components within normal limits  CBC - Abnormal; Notable for the following components:   Hemoglobin 15.2 (*)    All other components within normal limits  URINALYSIS, ROUTINE W REFLEX MICROSCOPIC - Abnormal; Notable for the following components:   Color, Urine AMBER (*)    APPearance CLOUDY (*)    Specific Gravity, Urine 1.031 (*)    Protein, ur 30 (*)    Leukocytes,Ua MODERATE (*)    All other components within normal limits  URINE CULTURE  LIPASE, BLOOD   RADIOLOGY  I personally viewed and evaluated these images as part of my medical decision making, as well as reviewing the written report by the radiologist.  ED Provider Interpretation: Acute colitis  CT ABDOMEN PELVIS W CONTRAST Result Date: 11/11/2023 CLINICAL DATA:  LLQ abdominal pain. EXAM: CT ABDOMEN AND PELVIS WITH CONTRAST TECHNIQUE: Multidetector CT imaging of the abdomen and pelvis was performed using the standard protocol following bolus administration of intravenous contrast. RADIATION DOSE REDUCTION: This  exam was performed according to the departmental dose-optimization program which includes automated exposure control, adjustment of the mA and/or kV according to patient size and/or use of iterative reconstruction technique. CONTRAST:  OMNIPAQUE  IOHEXOL  300 MG/ML  SOLN COMPARISON:  CT scan abdomen and pelvis from 11/26/2021. FINDINGS: Lower chest: There are patchy atelectatic changes in the visualized lung bases. No overt consolidation. No pleural effusion. The heart is normal in size. No pericardial effusion. Hepatobiliary: The liver is normal in size. Non-cirrhotic configuration. No suspicious mass. No intrahepatic or extrahepatic bile duct dilation. Gallbladder is surgically absent. Pancreas: Unremarkable. No pancreatic ductal dilatation or surrounding inflammatory changes. Spleen: Within normal limits. No focal lesion. Adrenals/Urinary Tract: Adrenal glands are unremarkable. No suspicious  renal mass. No hydronephrosis. No renal or ureteric calculi. Urinary bladder is under distended, precluding optimal assessment. However, no large mass or stones identified. No perivesical fat stranding. Stomach/Bowel: No disproportionate dilation of the small or large bowel loops. The appendix was not visualized; however there is no acute inflammatory process in the right lower quadrant. There is mild circumferential thickening of the hepatic flexure of colon, transverse colon, splenic flexure of colon, descending colon and proximal sigmoid colon with mild subtle pericolonic fat stranding. Findings are likely exaggerated by underdistention however, in appropriate clinical settings, favor colitis, most likely infective/inflammatory in etiology. No walled-off abscess or loculated collection. No pneumoperitoneum. Vascular/Lymphatic: No ascites. No abdominal or pelvic lymphadenopathy, by size criteria. No aneurysmal dilation of the major abdominal arteries. Reproductive: The uterus is surgically absent. No large adnexal mass. Other: The visualized soft tissues and abdominal wall are unremarkable. Musculoskeletal: No suspicious osseous lesions. IMPRESSION: 1. Mild circumferential thickening of the colon from hepatic flexure to proximal sigmoid colon, as described above. Findings favor colitis, most likely infective/inflammatory in etiology. No walled-off abscess or loculated collection. No pneumoperitoneum. 2. Multiple other nonacute observations, as described above. Electronically Signed   By: Ree Molt M.D.   On: 11/11/2023 16:08    PROCEDURES:  Critical Care performed: No  Procedures   MEDICATIONS ORDERED IN ED: Medications  sodium chloride  0.9 % bolus 1,000 mL (0 mLs Intravenous Stopped 11/11/23 1729)  morphine (PF) 4 MG/ML injection 4 mg (4 mg Intravenous Given 11/11/23 1421)  ondansetron  (ZOFRAN ) injection 4 mg (4 mg Intravenous Given 11/11/23 1421)  iohexol  (OMNIPAQUE ) 300 MG/ML solution 100 mL  (100 mLs Intravenous Contrast Given 11/11/23 1508)  dicyclomine  (BENTYL ) capsule 10 mg (10 mg Oral Given 11/11/23 1701)     IMPRESSION / MDM / ASSESSMENT AND PLAN / ED COURSE  I reviewed the triage vital signs and the nursing notes.                               43 y.o. female presents to the emergency department for evaluation and treatment of acute lower left quadrant pain. See HPI for further details.   Differential diagnosis includes, but is not limited to, ovarian cyst, ovarian torsion, acute appendicitis, diverticulitis, urinary tract infection/pyelonephritis, bowel obstruction, colitis, renal colic, gastroenteritis, hernia   Patient's presentation is most consistent with acute complicated illness / injury requiring diagnostic workup.  Plan obtain CT abdomen pelvis.  Low suspicion for ovarian torsion given history left oophorectomy.  Patient is alert and oriented.  Noted initial elevated pulse rate of 112.  Physical exam findings are pertinent for localized tenderness to the left lower quadrant.  Basic labs obtained and reassuring.  Urinalysis reveals moderate leukocytes however patient  does not report any urinary symptoms.  Will send urine culture.  CT abdomen and pelvis revealed mild circumferential thickening of the colon from hepatic flexure to proximal sigmoid colon favoring colitis.  Patient given IV fluids, morphine, Zofran  and Bentyl  for pain control.  She is in stable condition for discharge home and outpatient management.  Advised to adapt clear liquid plus bland diet and encourage increase in fluid intake.  Will refer to GI.  Antibiotic sent to pharmacy.  ED return precaution discussed.   FINAL CLINICAL IMPRESSION(S) / ED DIAGNOSES   Final diagnoses:  Colitis   Rx / DC Orders   ED Discharge Orders          Ordered    ciprofloxacin  (CIPRO ) 500 MG tablet  2 times daily        11/11/23 1656    metroNIDAZOLE  (FLAGYL ) 500 MG tablet  2 times daily        11/11/23 1656     fluconazole (DIFLUCAN) 150 MG tablet  As needed        11/11/23 1656    dicyclomine  (BENTYL ) 10 MG capsule  3 times daily before meals & bedtime        11/11/23 1656             Note:  This document was prepared using Dragon voice recognition software and may include unintentional dictation errors.    Margrette, Sander Remedios A, PA-C 11/11/23 1745    Waymond Lorelle Cummins, MD 11/11/23 [phone]

## 2023-11-12 LAB — URINE CULTURE
